# Patient Record
Sex: Male | Born: 1960 | Race: Black or African American | Hispanic: No | Marital: Single | Smoking: Current every day smoker
Health system: Southern US, Community
[De-identification: ages and names within clinical notes are randomized; demographics above are authoritative.]

## PROBLEM LIST (undated history)

## (undated) DIAGNOSIS — S31119A Laceration without foreign body of abdominal wall, unspecified quadrant without penetration into peritoneal cavity, initial encounter: Secondary | ICD-10-CM

## (undated) DIAGNOSIS — IMO0001 Reserved for inherently not codable concepts without codable children: Secondary | ICD-10-CM

## (undated) DIAGNOSIS — K219 Gastro-esophageal reflux disease without esophagitis: Secondary | ICD-10-CM

## (undated) DIAGNOSIS — M1990 Unspecified osteoarthritis, unspecified site: Secondary | ICD-10-CM

## (undated) DIAGNOSIS — W3400XA Accidental discharge from unspecified firearms or gun, initial encounter: Secondary | ICD-10-CM

## (undated) DIAGNOSIS — Z9289 Personal history of other medical treatment: Secondary | ICD-10-CM

## (undated) HISTORY — PX: ABDOMINAL SURGERY: SHX537

## (undated) HISTORY — PX: ORIF FEMORAL SHAFT FRACTURE W/ PLATES AND SCREWS: SUR931

## (undated) HISTORY — PX: HARDWARE REMOVAL: SHX979

## (undated) HISTORY — PX: ORIF ANKLE FRACTURE: SHX5408

---

## 1997-06-22 DIAGNOSIS — Z9289 Personal history of other medical treatment: Secondary | ICD-10-CM

## 1997-06-22 HISTORY — DX: Personal history of other medical treatment: Z92.89

## 1998-06-20 ENCOUNTER — Emergency Department (HOSPITAL_COMMUNITY): Admission: EM | Admit: 1998-06-20 | Discharge: 1998-06-20 | Payer: Self-pay | Admitting: Emergency Medicine

## 1999-06-01 ENCOUNTER — Encounter: Payer: Self-pay | Admitting: General Surgery

## 1999-06-01 ENCOUNTER — Inpatient Hospital Stay (HOSPITAL_COMMUNITY): Admission: EM | Admit: 1999-06-01 | Discharge: 1999-06-07 | Payer: Self-pay

## 1999-06-02 ENCOUNTER — Encounter: Payer: Self-pay | Admitting: General Surgery

## 1999-06-03 ENCOUNTER — Encounter: Payer: Self-pay | Admitting: General Surgery

## 1999-06-04 ENCOUNTER — Encounter: Payer: Self-pay | Admitting: General Surgery

## 1999-06-05 ENCOUNTER — Encounter: Payer: Self-pay | Admitting: General Surgery

## 1999-06-06 ENCOUNTER — Encounter: Payer: Self-pay | Admitting: General Surgery

## 2000-07-01 ENCOUNTER — Ambulatory Visit (HOSPITAL_COMMUNITY): Admission: RE | Admit: 2000-07-01 | Discharge: 2000-07-01 | Payer: Self-pay | Admitting: *Deleted

## 2002-02-05 ENCOUNTER — Emergency Department (HOSPITAL_COMMUNITY): Admission: EM | Admit: 2002-02-05 | Discharge: 2002-02-05 | Payer: Self-pay | Admitting: Emergency Medicine

## 2002-06-30 ENCOUNTER — Encounter: Admission: RE | Admit: 2002-06-30 | Discharge: 2002-06-30 | Payer: Self-pay | Admitting: Internal Medicine

## 2004-05-20 ENCOUNTER — Emergency Department (HOSPITAL_COMMUNITY): Admission: EM | Admit: 2004-05-20 | Discharge: 2004-05-20 | Payer: Self-pay | Admitting: Family Medicine

## 2004-05-20 ENCOUNTER — Ambulatory Visit: Payer: Self-pay | Admitting: Family Medicine

## 2004-10-27 ENCOUNTER — Emergency Department (HOSPITAL_COMMUNITY): Admission: EM | Admit: 2004-10-27 | Discharge: 2004-10-27 | Payer: Self-pay | Admitting: Family Medicine

## 2005-05-13 ENCOUNTER — Ambulatory Visit: Payer: Self-pay | Admitting: Nurse Practitioner

## 2005-10-28 ENCOUNTER — Ambulatory Visit: Payer: Self-pay | Admitting: Nurse Practitioner

## 2005-11-10 ENCOUNTER — Ambulatory Visit: Payer: Self-pay | Admitting: Nurse Practitioner

## 2006-12-16 ENCOUNTER — Ambulatory Visit: Payer: Self-pay | Admitting: Family Medicine

## 2007-10-04 ENCOUNTER — Ambulatory Visit: Payer: Self-pay | Admitting: Family Medicine

## 2008-05-07 ENCOUNTER — Encounter: Payer: Self-pay | Admitting: Physician Assistant

## 2008-05-07 ENCOUNTER — Encounter (INDEPENDENT_AMBULATORY_CARE_PROVIDER_SITE_OTHER): Payer: Self-pay | Admitting: Internal Medicine

## 2008-05-07 ENCOUNTER — Ambulatory Visit: Payer: Self-pay | Admitting: Family Medicine

## 2008-05-07 LAB — CONVERTED CEMR LAB
ALT: 14 units/L (ref 0–53)
Albumin: 4.9 g/dL (ref 3.5–5.2)
BUN: 14 mg/dL (ref 6–23)
Basophils Relative: 0 % (ref 0–1)
Chloride: 105 meq/L (ref 96–112)
Cholesterol: 222 mg/dL — ABNORMAL HIGH (ref 0–200)
Creatinine, Ser: 1.44 mg/dL (ref 0.40–1.50)
Eosinophils Relative: 2 % (ref 0–5)
Hemoglobin: 16.5 g/dL (ref 13.0–17.0)
Lymphocytes Relative: 36 % (ref 12–46)
Monocytes Absolute: 0.3 10*3/uL (ref 0.1–1.0)
Monocytes Relative: 7 % (ref 3–12)
Neutro Abs: 2.8 10*3/uL (ref 1.7–7.7)
Potassium: 4.4 meq/L (ref 3.5–5.3)
Total Bilirubin: 0.4 mg/dL (ref 0.3–1.2)
Total Protein: 7.8 g/dL (ref 6.0–8.3)
Triglycerides: 592 mg/dL — ABNORMAL HIGH (ref <150)
WBC: 5 10*3/uL (ref 4.0–10.5)

## 2009-10-17 ENCOUNTER — Inpatient Hospital Stay (HOSPITAL_COMMUNITY): Admission: EM | Admit: 2009-10-17 | Discharge: 2009-10-19 | Payer: Self-pay | Admitting: Emergency Medicine

## 2009-10-29 ENCOUNTER — Ambulatory Visit: Payer: Self-pay | Admitting: Physician Assistant

## 2009-10-29 ENCOUNTER — Telehealth: Payer: Self-pay | Admitting: Physician Assistant

## 2009-10-29 DIAGNOSIS — M545 Low back pain: Secondary | ICD-10-CM | POA: Insufficient documentation

## 2009-10-29 DIAGNOSIS — J4489 Other specified chronic obstructive pulmonary disease: Secondary | ICD-10-CM | POA: Insufficient documentation

## 2009-10-29 DIAGNOSIS — K219 Gastro-esophageal reflux disease without esophagitis: Secondary | ICD-10-CM

## 2009-10-29 DIAGNOSIS — J449 Chronic obstructive pulmonary disease, unspecified: Secondary | ICD-10-CM | POA: Insufficient documentation

## 2009-10-29 DIAGNOSIS — E781 Pure hyperglyceridemia: Secondary | ICD-10-CM | POA: Insufficient documentation

## 2009-10-29 LAB — CONVERTED CEMR LAB: HIV: NEGATIVE

## 2009-10-30 LAB — CONVERTED CEMR LAB
AST: 18 units/L (ref 0–37)
Albumin: 4.5 g/dL (ref 3.5–5.2)
Barbiturate Quant, Ur: NEGATIVE
Basophils Absolute: 0 10*3/uL (ref 0.0–0.1)
CO2: 24 meq/L (ref 19–32)
Calcium: 9.5 mg/dL (ref 8.4–10.5)
Chloride: 101 meq/L (ref 96–112)
Cocaine Metabolites: POSITIVE — AB
Creatinine, Ser: 1.17 mg/dL (ref 0.40–1.50)
Creatinine,U: 217.6 mg/dL
HCT: 44.2 % (ref 39.0–52.0)
Lymphs Abs: 1.7 10*3/uL (ref 0.7–4.0)
Methadone: NEGATIVE
Monocytes Absolute: 0.4 10*3/uL (ref 0.1–1.0)
Neutrophils Relative %: 61 % (ref 43–77)
Platelets: 387 10*3/uL (ref 150–400)
Potassium: 4.6 meq/L (ref 3.5–5.3)
Propoxyphene: NEGATIVE
RDW: 12.8 % (ref 11.5–15.5)
TSH: 1.528 microintl units/mL (ref 0.350–4.500)
Total Bilirubin: 0.4 mg/dL (ref 0.3–1.2)

## 2009-11-22 ENCOUNTER — Ambulatory Visit (HOSPITAL_COMMUNITY): Admission: RE | Admit: 2009-11-22 | Discharge: 2009-11-22 | Payer: Self-pay | Admitting: Internal Medicine

## 2009-11-27 ENCOUNTER — Encounter: Payer: Self-pay | Admitting: Physician Assistant

## 2009-11-28 ENCOUNTER — Encounter: Payer: Self-pay | Admitting: Physician Assistant

## 2009-12-09 ENCOUNTER — Telehealth: Payer: Self-pay | Admitting: Physician Assistant

## 2009-12-28 ENCOUNTER — Encounter: Payer: Self-pay | Admitting: Physician Assistant

## 2010-01-29 ENCOUNTER — Ambulatory Visit: Payer: Self-pay | Admitting: Physician Assistant

## 2010-01-29 DIAGNOSIS — D485 Neoplasm of uncertain behavior of skin: Secondary | ICD-10-CM

## 2010-01-29 LAB — CONVERTED CEMR LAB
Cholesterol: 156 mg/dL (ref 0–200)
LDL Cholesterol: 86 mg/dL (ref 0–99)
Triglycerides: 96 mg/dL (ref <150)

## 2010-01-31 ENCOUNTER — Encounter: Payer: Self-pay | Admitting: Physician Assistant

## 2010-03-27 ENCOUNTER — Ambulatory Visit: Payer: Self-pay | Admitting: Physician Assistant

## 2010-03-27 DIAGNOSIS — M25519 Pain in unspecified shoulder: Secondary | ICD-10-CM | POA: Insufficient documentation

## 2010-03-27 DIAGNOSIS — M542 Cervicalgia: Secondary | ICD-10-CM | POA: Insufficient documentation

## 2010-07-22 NOTE — Progress Notes (Signed)
Summary: needs FLP  Phone Note Outgoing Call   Summary of Call: Forgot to put on his d/c sheet that he needs to come back to the lab before his next appt for Fasting Lipids. Please call him and set this up. Initial call taken by: Brynda Rim,  Oct 29, 2009 1:26 PM  Follow-up for Phone Call        tried calling pt but no answer .Marland KitchenMarland KitchenArmenia Shannon  Oct 30, 2009 8:43 AM   Additional Follow-up for Phone Call Additional follow up Details #1::        Graciela, please call to pt's to schedule.  Left message on the voice mail.Manon Hilding  Nov 04, 2009 2:47 PM Additional Follow-up by: Mikey College CMA,  Nov 01, 2009 2:38 PM    Additional Follow-up for Phone Call Additional follow up Details #2::    PATIENT IS SCHEDULED TO DO FASTING LABS 05/27 AT 9:10. Follow-up by: Leodis Rains,  Nov 07, 2009 4:52 PM

## 2010-07-22 NOTE — Letter (Signed)
Summary: *HSN Results Follow up  [facility]  [address] [place], [place] [address]   Phone: [phone]  Fax: [phone]      11/27/2009   LISTON THUM [address] [place], [place]  [address]   Dear  Mr. IZEN PETZ,                            ____S.Drinkard,FNP   ____D. Gore,FNP       ____B. McPherson,MD   ____V. Rankins,MD    ____E. Mulberry,MD    ____N. Daphine Deutscher, FNP  ____D. Reche Dixon, MD    ____K. Philipp Deputy, MD    __x__S. Alben Spittle, PA-C    This letter is to inform you that your recent test(s):  _______Pap Smear    _______Lab Test     ___x____X-ray    ___x____ is within acceptable limits  _______ requires a medication change  _______ requires a follow-up lab visit  _______ requires a follow-up visit with your provider   Comments:  Your MRI on your back demonstrates that your discs are normal and you just have some very mild arthritis.  That is good news.       _________________________________________________________ If you have any questions, please contact our office                     Sincerely,  Tereso Newcomer PA-C [facility]

## 2010-07-22 NOTE — Letter (Signed)
Summary: AGREEMENT FOR CONTROLLED PRESCRIPTIONS  AGREEMENT FOR CONTROLLED PRESCRIPTIONS   Imported By: Arta Bruce 11/04/2009 09:24:38  _____________________________________________________________________  External Attachment:    Type:   Image     Comment:   External Document
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
## 2010-07-22 NOTE — Letter (Signed)
Summary: [facility]  [facility]   Imported By: Arta Bruce 04/07/2010 14:46:52  _____________________________________________________________________  External Attachment:    Type:   Image     Comment:   External Document

## 2010-07-22 NOTE — Letter (Signed)
Summary: *HSN Results Follow up  [facility]  [address] [place], [place] [address]   Phone: [phone]  Fax: [phone]      01/31/2010   Ethan Martinez [address] [place], [place]  [address]   Dear  [name],                            ____S.Drinkard,FNP   ____D. Gore,FNP       ____B. McPherson,MD   ____V. Rankins,MD    ____E. Mulberry,MD    ____N. Daphine Deutscher, FNP  ____D. Reche Dixon, MD    ____K. Philipp Deputy, MD    __x__S. Alben Spittle, PA-C     This letter is to inform you that your recent test(s):  _______Pap Smear    ___x____Lab Test     _______X-ray    ___x____ is within acceptable limits  _______ requires a medication change  _______ requires a follow-up lab visit  _______ requires a follow-up visit with your provider   Comments: Your cholesterol numbers are normal without you being on medicine.  You do not need medicine for your cholesterol.       _________________________________________________________ If you have any questions, please contact our office                     Sincerely,  Tereso Newcomer PA-C [facility]

## 2010-07-22 NOTE — Assessment & Plan Note (Signed)
Summary: right shoulder and neck pain   Vital Signs:  Patient profile:   50 year old male Weight:      170.9 pounds Temp:     97.7 degrees F oral Pulse rate:   84 / minute Pulse rhythm:   regular Resp:     18 per minute BP sitting:   114 / 80  (left arm) Cuff size:   regular  Vitals Entered By: CMA Student Kenyatta  CC: follow-up visit left leg pain, patient still having leg pain, needs medication refill Is Patient Diabetic? No Pain Assessment Patient in pain? yes     Location: left leg Intensity: 5 Type: aching  Does patient need assistance? Functional Status Self care Ambulation Normal   Primary Care Provider:  Tereso Newcomer, PA-C  CC:  follow-up visit left leg pain, patient still having leg pain, and needs medication refill.  History of Present Illness: Here for right shoulder and neck pain.  He broke his arm years ago.  Says he wore a cast.  Also reports going to get injections in his shoulder at one point.  He bumped into a door frame recently.  Since then, his shoulder and neck have been bothering him.  He points to pain over his trap muscle and his anterior shoulder.  He has full ROM.  He does not have naprosyn anymore.  He notes more pain at night and has a hard time sleeping.  Any type of movement elicits pain.    He is also asking for a form to be filled out from [place] [employer] to forgive school loans related to his disability.  I reviewed the form.  It asks specific ques about his dx for disability and his functional limitations.  I have asked him to obtain a copy of his judgement to bring in.  I do not have access to the specifics of his disability.  Once I have this, I can try to fill out his form.  Problems Prior to Update: 1)  Neck Pain  (ICD-723.1) 2)  Shoulder Pain, Right  (ICD-719.41) 3)  Lesion, Scalp  (ICD-238.2) 4)  Preventive Health Care  (ICD-V70.0) 5)  Hypertriglyceridemia  (ICD-272.1) 6)  Low Back Pain, Chronic  (ICD-724.2) 7)  COPD  (ICD-496) 8)   Gerd  (ICD-530.81)  Current Medications (verified): 1)  Pepcid 20 Mg Tabs (Famotidine) .... Take 1 Tablet By Mouth Two Times A Day As Needed For Indigestion 2)  Naproxen 500 Mg Tabs (Naproxen) .... Take 1 Tablet By Mouth Two Times A Day As Needed For Pain (Take With Food) 3)  Robaxin 500 Mg Tabs (Methocarbamol) .Marland Kitchen.. 1-2 Tabs By Mouth Every 8 Hours As Needed For Back Pain or Spasm 4)  Not Allowed To Receive Narcotics At This Office .... See Labs Ammended 10/30/2009 5)  Famotidine 20 Mg Tabs (Famotidine) .... Take 1 Tablet Twice Dailyas Needed For Indigestion 6)  Doxycycline Hyclate 100 Mg Caps (Doxycycline Hyclate) .... Take 1 Capsule Twice Daily 7)  Ketoconazole 2 % Sham (Ketoconazole) .... Lather Into Scalp and Rinse Out After 5 Minutes; Repeat 1 Time A Week For 4 Weeks  Allergies (verified): No Known Drug Allergies  Physical Exam  General:  alert, well-developed, and well-nourished.   Head:  normocephalic and atraumatic.   Lungs:  normal breath sounds.   Heart:  normal rate and regular rhythm.   Neurologic:  alert & oriented X3 and cranial nerves II-XII intact.   BUE strength is normal and equal Psych:  normally  interactive.     Shoulder/Elbow Exam  Shoulder Exam:    Right:    Inspection:  Normal    Palpation:  Normal    Stability:  stable    Tenderness:  right bicipital groove    Swelling:  no    Erythema:  no    + crepitus noted empty can test neg   Impression & Recommendations:  Problem # 1:  NECK PAIN (ICD-723.1) likely strained nsaids and muscle relaxers note:  pain contract broken and he cannot have narcotics check shoulder xray . . .if ok, send to PT  His updated medication list for this problem includes:    Naproxen 500 Mg Tabs (Naproxen) .Marland Kitchen... Take 1 tablet by mouth two times a day as needed for pain (take with food)    Robaxin 500 Mg Tabs (Methocarbamol) .Marland Kitchen... 1-2 tabs by mouth every 8 hours as needed for pain or spasm  Problem # 2:  SHOULDER PAIN, RIGHT  (ICD-719.41)  prob strain nsaids and muscle relaxers check xray if sig DJD, send back to ortho for poss injection if ok, send to PT for neck and shoulder  His updated medication list for this problem includes:    Naproxen 500 Mg Tabs (Naproxen) .Marland Kitchen... Take 1 tablet by mouth two times a day as needed for pain (take with food)    Robaxin 500 Mg Tabs (Methocarbamol) .Marland Kitchen... 1-2 tabs by mouth every 8 hours as needed for pain or spasm  Orders: Diagnostic X-Ray/Fluoroscopy (Diagnostic X-Ray/Flu)  Complete Medication List: 1)  Pepcid 20 Mg Tabs (Famotidine) .... Take 1 tablet by mouth two times a day as needed for indigestion 2)  Naproxen 500 Mg Tabs (Naproxen) .... Take 1 tablet by mouth two times a day as needed for pain (take with food) 3)  Robaxin 500 Mg Tabs (Methocarbamol) .Marland Kitchen.. 1-2 tabs by mouth every 8 hours as needed for pain or spasm 4)  Not Allowed To Receive Narcotics At This Office  .... See labs ammended 10/30/2009 5)  Famotidine 20 Mg Tabs (Famotidine) .... Take 1 tablet twice dailyas needed for indigestion 6)  Doxycycline Hyclate 100 Mg Caps (Doxycycline hyclate) .... Take 1 capsule twice daily 7)  Ketoconazole 2 % Sham (Ketoconazole) .... Lather into scalp and rinse out after 5 minutes; repeat 1 time a week for 4 weeks  Patient Instructions: 1)  Take naproxen two times a day with food for 3 days, every day.  Then, take two times a day with food as needed for pain. 2)  Use robaxin as needed for spasm or pain. 3)  Get the xrays done. 4)  We will either send you to the orthopedist or physical therapist depending on xray results 5)  Schedule follow up if symptoms worsen or not getting better. Prescriptions: ROBAXIN 500 MG TABS (METHOCARBAMOL) 1-2 tabs by mouth every 8 hours as needed for pain or spasm  #30 x 0   Entered and Authorized by:   Tereso Newcomer PA-C   Signed by:   Tereso Newcomer PA-C on 03/27/2010   Method used:   Print then Give to Patient   RxID:    [id number] NAPROXEN 500 MG TABS (NAPROXEN) Take 1 tablet by mouth two times a day as needed for pain (take with food)  #60 x 0   Entered and Authorized by:   Tereso Newcomer PA-C   Signed by:   Tereso Newcomer PA-C on 03/27/2010   Method used:   Print then Give to Patient   RxID:  [id number]  

## 2010-07-22 NOTE — Letter (Signed)
Summary: [facility]   Imported By: Arta Bruce 04/07/2010 14:56:10  _____________________________________________________________________  External Attachment:    Type:   Image     Comment:   External Document

## 2010-07-22 NOTE — Progress Notes (Signed)
Summary: Wants ketoconazole shampoo asap  Phone Note Call from Patient   Reason for Call: Refill Medication Summary of Call: ZEMSIBROZIL//STATIN NEED SHAMPOO/   KETOCONAZOLE  HEAD AN CHEST(WHERE EVER HE HAS HAIR ) NEEDS THIS BAD PHARMACY [facility] WILL CALL AND MAKE AN APPT AFTER JUNE 27 TH,HAS ANOTHER APPT ON THAT DATE AND WILL COME SEE YOU AFTER THAT ONE . CAN BE REACHED AT HOME//[phone] //CELL//[phone] Initial call taken by: Arta Bruce,  December 09, 2009 8:38 AM  Follow-up for Phone Call        [place], Can you please clarify what the request is? Follow-up by: Tereso Newcomer PA-C,  December 10, 2009 1:03 PM  Additional Follow-up for Phone Call Additional follow up Details #1::        He states that he has "high yeast level" -- has rash wherever he has hair -- red, scaly, itches badly and whenever he combs his hair, the areas bleed.  States he went to [facility] a long time ago, and was prescribed ketoconazole shampoo and needs it again "it's like an emergency".  States he had  this itching and scaly skin when he saw you in May, but just forgot to tell you about it.  Uses [facility] on [address].    Also wanted to get his Gemfibrozil reordered -- appt. made for FLP 12/30/09.  Dutch Quint RN  December 11, 2009 10:56 AM  Additional Follow-up by: Dutch Quint RN,  December 11, 2009 12:23 PM    Additional Follow-up for Phone Call Additional follow up Details #2::      I need to see this rash before I give him a prescription. Selsun shampoo is very similar to what he is requesting and often used for some of the same conditions as ketoconazole.  He can use that in the meantime.  Try to get him in for a visit as soon as we can. . . either me or another provider.  I requested a fasting lipid panel on him in May.  Have not seen anything come back yet.  I need this before I decide what he should be on for his cholesterol.  Follow-up by: Tereso Newcomer PA-C,  December 11, 2009  1:46 PM  Additional Follow-up for Phone Call Additional follow up Details #3:: Details for Additional Follow-up Action Taken: Appt made for FLP and office visit on 12/30/09 per his request.  Advised per provider's recommendations -- will try Selsun shampoo.  Dutch Quint RN  December 11, 2009 2:52 PM
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
## 2010-09-09 LAB — PROTIME-INR
INR: 1.01 (ref 0.00–1.49)
Prothrombin Time: 13.2 seconds (ref 11.6–15.2)

## 2010-09-09 LAB — BASIC METABOLIC PANEL
Chloride: 112 mEq/L (ref 96–112)
Glucose, Bld: 132 mg/dL — ABNORMAL HIGH (ref 70–99)

## 2010-09-09 LAB — CBC
HCT: 45.4 % (ref 39.0–52.0)
Hemoglobin: 15.7 g/dL (ref 13.0–17.0)
Platelets: 249 10*3/uL (ref 150–400)
RBC: 4.99 MIL/uL (ref 4.22–5.81)
RDW: 13.2 % (ref 11.5–15.5)

## 2010-09-09 LAB — TYPE AND SCREEN

## 2010-09-09 LAB — ABO/RH: ABO/RH(D): A POS
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
## 2011-12-15 ENCOUNTER — Encounter (HOSPITAL_COMMUNITY): Payer: Self-pay | Admitting: *Deleted

## 2011-12-15 ENCOUNTER — Emergency Department (HOSPITAL_COMMUNITY)
Admission: EM | Admit: 2011-12-15 | Discharge: 2011-12-15 | Disposition: A | Discharge status: Home / Self Care | Payer: Medicare Other | Attending: Emergency Medicine | Admitting: Emergency Medicine

## 2011-12-15 DIAGNOSIS — R21 Rash and other nonspecific skin eruption: Secondary | ICD-10-CM | POA: Insufficient documentation

## 2011-12-15 DIAGNOSIS — F17200 Nicotine dependence, unspecified, uncomplicated: Secondary | ICD-10-CM | POA: Insufficient documentation

## 2011-12-15 DIAGNOSIS — L509 Urticaria, unspecified: Secondary | ICD-10-CM

## 2011-12-15 MED ORDER — DOXYCYCLINE HYCLATE 100 MG PO CAPS: 100.0000 mg | ORAL_CAPSULE | Freq: Two times a day (BID) | ORAL | Status: AC

## 2011-12-15 MED ORDER — KETOCONAZOLE 2 % EX SHAM: MEDICATED_SHAMPOO | CUTANEOUS | Status: AC

## 2011-12-15 MED ORDER — METHYLPREDNISOLONE SODIUM SUCC 125 MG IJ SOLR
125.0000 mg | Freq: Once | INTRAMUSCULAR | Status: AC
  Administered 2011-12-15: 125 mg via INTRAMUSCULAR
  Filled 2011-12-15: qty 2

## 2011-12-15 NOTE — ED Notes (Signed)
Pt is here with rashes to buttocks  And upper legs and arms for last two days

## 2011-12-15 NOTE — Discharge Instructions (Signed)
You were seen and evaluated for your symptoms of rash. Your given a dose of steroids to help with your symptoms. It is recommended that you use Benadryl for itch and rash symptoms at home. You were also given prescription for an antibiotic for concerns of a skin infection. Please take this as prescribed for the full length of time. You were also given prescriptions for shampoo to use for your complaints of dry and irritated scalp. Please followup with your primary care provider for continued evaluation and treatment.   Allergic Reaction, Mild to Moderate Allergies may happen from anything your body is sensitive to. This may be food, medications, pollens, chemicals, and nearly anything around you in everyday life that produces allergens. An allergen is anything that causes an allergy producing substance. Allergens cause your body to release allergic antibodies. Through a chain of events, they cause a release of histamine into the blood stream. Histamines are meant to protect you, but they also cause your discomfort. This is why antihistamines are often used for allergies. Heredity is often a factor in causing allergic reactions. This means you may have some of the same allergies as your parents. Allergies happen in all age groups. You may have some idea of what caused your reaction. There are many allergens around [place]. It may be difficult to know what caused your reaction. If this is a first time event, it may never happen again. Allergies cannot be cured but can be controlled with medications. SYMPTOMS  You may get some or all of the following problems from allergies.  Swelling and itching in and around the mouth.   Tearing, itchy eyes.   Nasal congestion and runny nose.   Sneezing and coughing.   An itchy red rash or hives.   Vomiting or diarrhea.   Difficulty breathing.  Seasonal allergies occur in all age groups. They are seasonal because they usually occur during the same season every year.  They may be a reaction to molds, grass pollens, or tree pollens. Other causes of allergies are house dust mite allergens, pet dander and mold spores. These are just a common few of the thousands of allergens around [place]. All of the symptoms listed above happen when you come in contact with pollens and other allergens. Seasonal allergies are usually not life threatening. They are generally more of a nuisance that can often be handled using medications. Hay fever is a combination of all or some of the above listed allergy problems. It may often be treated with simple over-the-counter medications such as diphenhydramine. Take medication as directed. Check with your caregiver or package insert for child dosages. TREATMENT AND HOME CARE INSTRUCTIONS If hives or rash are present:  Take medications as directed.   You may use an over-the-counter antihistamine (diphenhydramine) for hives and itching as needed. Do not drive or drink alcohol until medications used to treat the reaction have worn off. Antihistamines tend to make people sleepy.   Apply cold cloths (compresses) to the skin or take baths in cool water. This will help itching. Avoid hot baths or showers. Heat will make a rash and itching worse.   If your allergies persist and become more severe, and over the counter medications are not effective, there are many new medications your caretaker can prescribe. Immunotherapy or desensitizing injections can be used if all else fails. Follow up with your caregiver if problems continue.  SEEK MEDICAL CARE IF:   Your allergies are becoming progressively more troublesome.   You suspect  a food allergy. Symptoms generally happen within 30 minutes of eating a food.   Your symptoms have not gone away within 2 days or are getting worse.   You develop new symptoms.   You want to retest yourself or your child with a food or drink you think causes an allergic reaction. Never test yourself or your child of a  suspected allergy without being under the watchful eye of your caregivers. A second exposure to an allergen may be life-threatening.  SEEK IMMEDIATE MEDICAL CARE IF:  You develop difficulty breathing or wheezing, or have a tight feeling in your chest or throat.   You develop a swollen mouth, hives, swelling, or itching all over your body.  A severe reaction with any of the above problems should be considered life-threatening. If you suddenly develop difficulty breathing call for local emergency medical help. THIS IS AN EMERGENCY. MAKE SURE YOU:   Understand these instructions.   Will watch your condition.   Will get help right away if you are not doing well or get worse.  Document Released: 04/05/2007 Document Revised: 05/28/2011 Document Reviewed: 04/05/2007 [facility] Patient Information 2012 [place], [place].    Hives Hives (urticaria) are itchy, red, swollen patches on the skin. They may change size, shape, and location quickly and repeatedly. Hives that occur deeper in the skin can cause swelling of the hands, feet, and face. Hives may be an allergic reaction to something you or your child ate, touched, or put on the skin. Hives can also be a reaction to cold, heat, viral infections, medication, insect bites, or emotional stress. Often the cause is hard to find. Hives can come and go for several days to several weeks. Hives are not contagious. HOME CARE INSTRUCTIONS   If the cause of the hives is known, avoid exposure to that source.   To relieve itching and rash:   Apply cold compresses to the skin or take cool water baths. Do not take or give your child hot baths or showers because the warmth will make the itching worse.   The best medicine for hives is an antihistamine. An antihistamine will not cure hives, but it will reduce their severity. You can use an antihistamine available over the counter. This medicine may make your child sleepy. Teenagers should not drive while using this  medicine.   Take or give an antihistamine every 6 hours until the hives are completely gone for 24 hours or as directed.   Your child may have other medications prescribed for itching. Give these as directed by your child's caregiver.   You or your child should wear loose fitting clothing, including undergarments. Skin irritations may make hives worse.   Follow-up as directed by your caregiver.  SEEK MEDICAL CARE IF:   You or your child still have considerable itching after taking the medication (prescribed or purchased over the counter).   Joint swelling or pain occurs.  SEEK IMMEDIATE MEDICAL CARE IF:   You have a fever.   Swollen lips or tongue are noticed.   There is difficulty with breathing, swallowing, or tightness in the throat or chest.   Abdominal pain develops.   Your child starts acting very sick.  These may be the first signs of a life-threatening allergic reaction. THIS IS AN EMERGENCY. Call 911 for medical help. MAKE SURE YOU:   Understand these instructions.   Will watch your condition.   Will get help right away if you are not doing well or get worse.  Document Released: 06/08/2005 Document Revised: 05/28/2011 Document Reviewed: 01/27/2008 [facility] Patient Information 2012 [place], [place].
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
## 2011-12-16 NOTE — ED Provider Notes (Signed)
Medical screening examination/treatment/procedure(s) were performed by non-physician practitioner and as supervising physician I was immediately available for consultation/collaboration.  Sunnie Nielsen, MD 12/16/11 [phone]

## 2012-01-28 ENCOUNTER — Emergency Department (INDEPENDENT_AMBULATORY_CARE_PROVIDER_SITE_OTHER)
Admission: EM | Admit: 2012-01-28 | Discharge: 2012-01-28 | Disposition: A | Discharge status: Home / Self Care | Payer: Medicare Other | Source: Home / Self Care

## 2012-01-28 ENCOUNTER — Encounter (HOSPITAL_COMMUNITY): Payer: Self-pay | Admitting: *Deleted

## 2012-01-28 DIAGNOSIS — S0993XA Unspecified injury of face, initial encounter: Secondary | ICD-10-CM

## 2012-01-28 DIAGNOSIS — S0011XA Contusion of right eyelid and periocular area, initial encounter: Secondary | ICD-10-CM

## 2012-01-28 DIAGNOSIS — S199XXA Unspecified injury of neck, initial encounter: Secondary | ICD-10-CM

## 2012-01-28 DIAGNOSIS — S0510XA Contusion of eyeball and orbital tissues, unspecified eye, initial encounter: Secondary | ICD-10-CM

## 2012-01-28 MED ORDER — ACETAMINOPHEN 325 MG PO TABS
650.0000 mg | ORAL_TABLET | Freq: Once | ORAL | Status: AC
  Administered 2012-01-28: 650 mg via ORAL

## 2012-01-28 MED ORDER — ACETAMINOPHEN 325 MG PO TABS
ORAL_TABLET | ORAL | Status: AC
  Filled 2012-01-28: qty 2

## 2012-01-28 MED ORDER — TRIAMCINOLONE ACETONIDE 0.1 % EX CREA: TOPICAL_CREAM | Freq: Two times a day (BID) | CUTANEOUS | Status: AC

## 2012-01-28 NOTE — ED Notes (Signed)
Pt given ice pack and visual acuity preformed
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
## 2012-01-29 NOTE — ED Provider Notes (Signed)
Medical screening examination/treatment/procedure(s) were performed by non-physician practitioner and as supervising physician I was immediately available for consultation/collaboration.   [facility]; MD   Sharin Grave, MD 01/30/12 [phone]
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
## 2012-03-03 IMAGING — CR DG CHEST 2V
2 series · 2 of 2 positions shown · non-contrast
Comparison: None.

CLINICAL DATA: Preop for ankle surgery, smoking history

CHEST - 2 VIEW

[w chest lat]
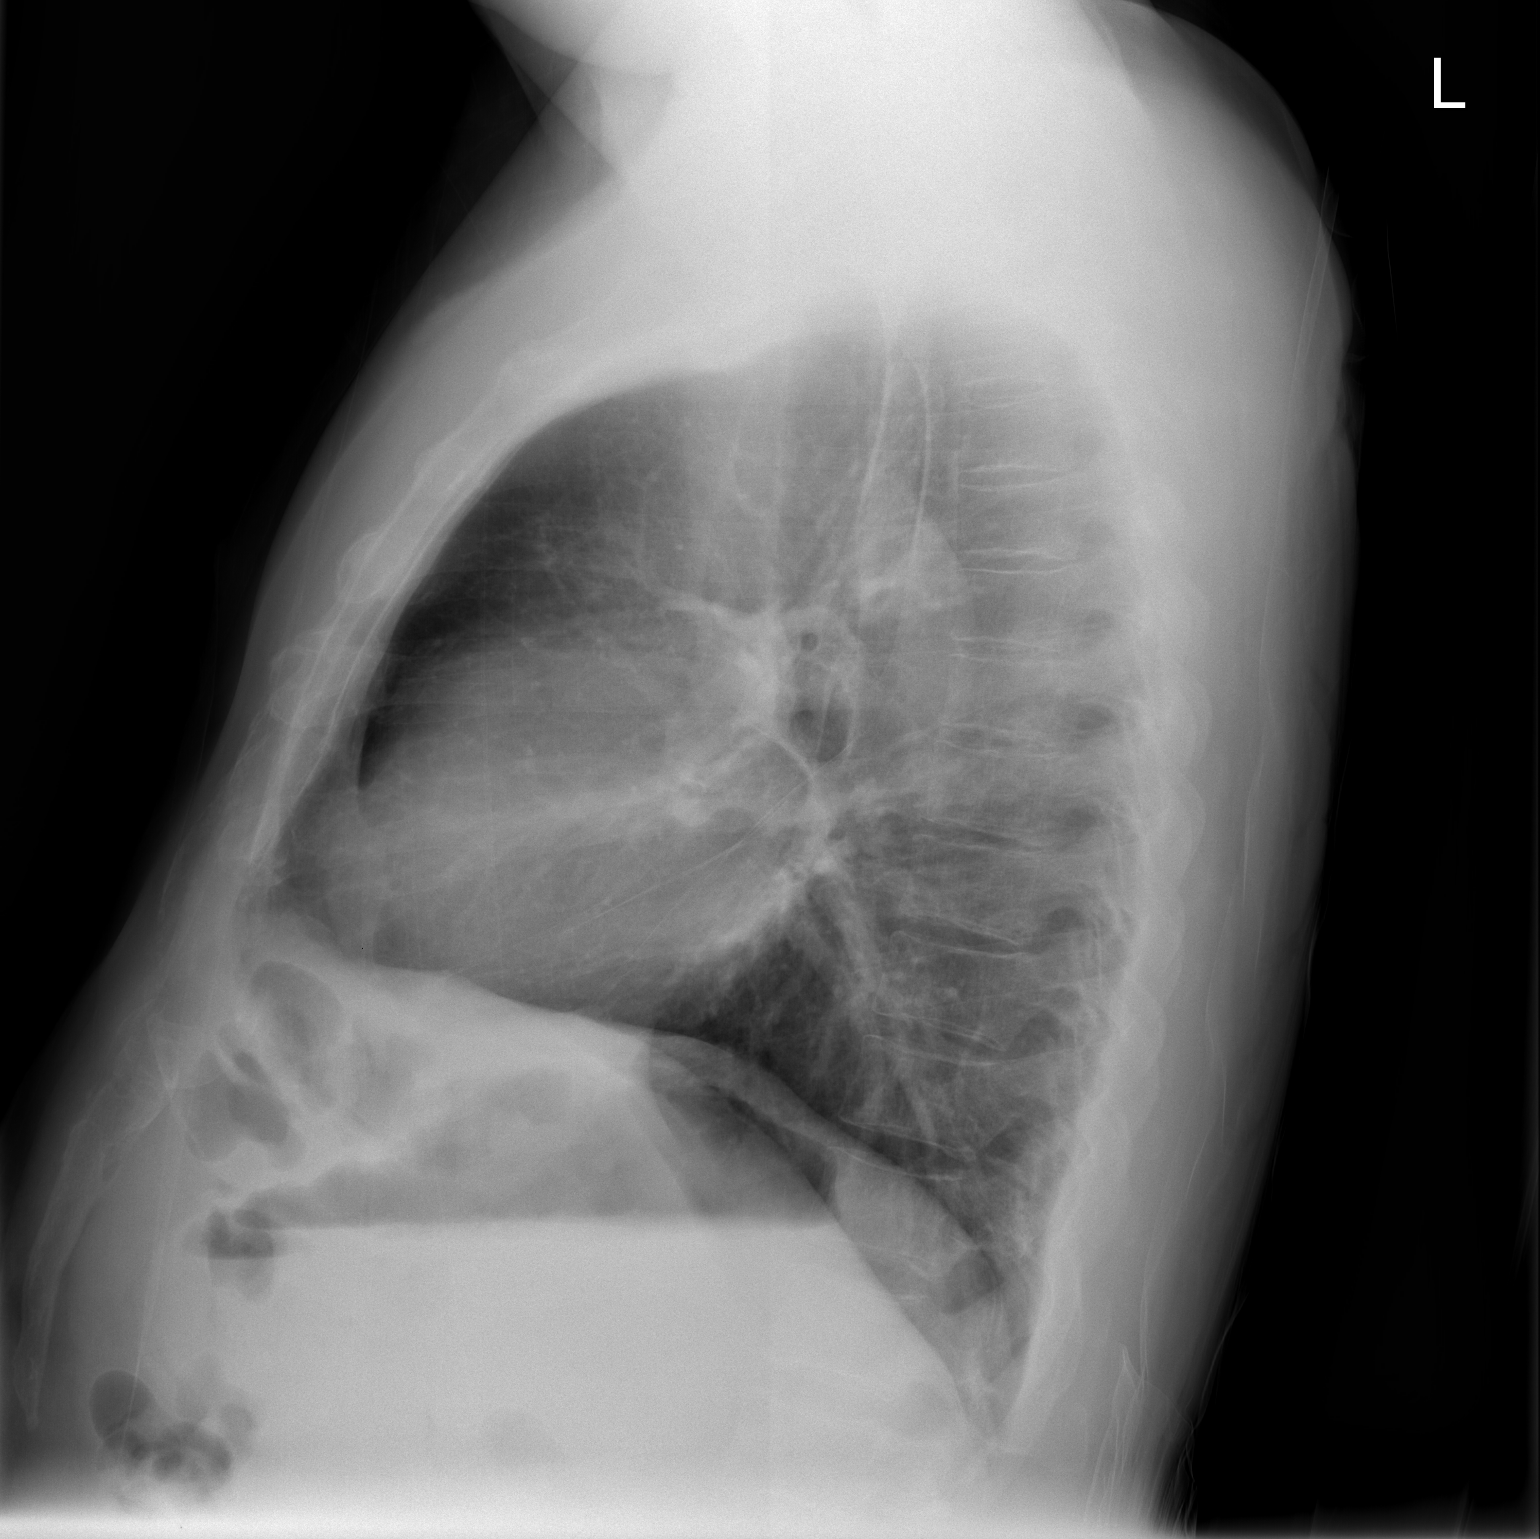

[view not recorded]
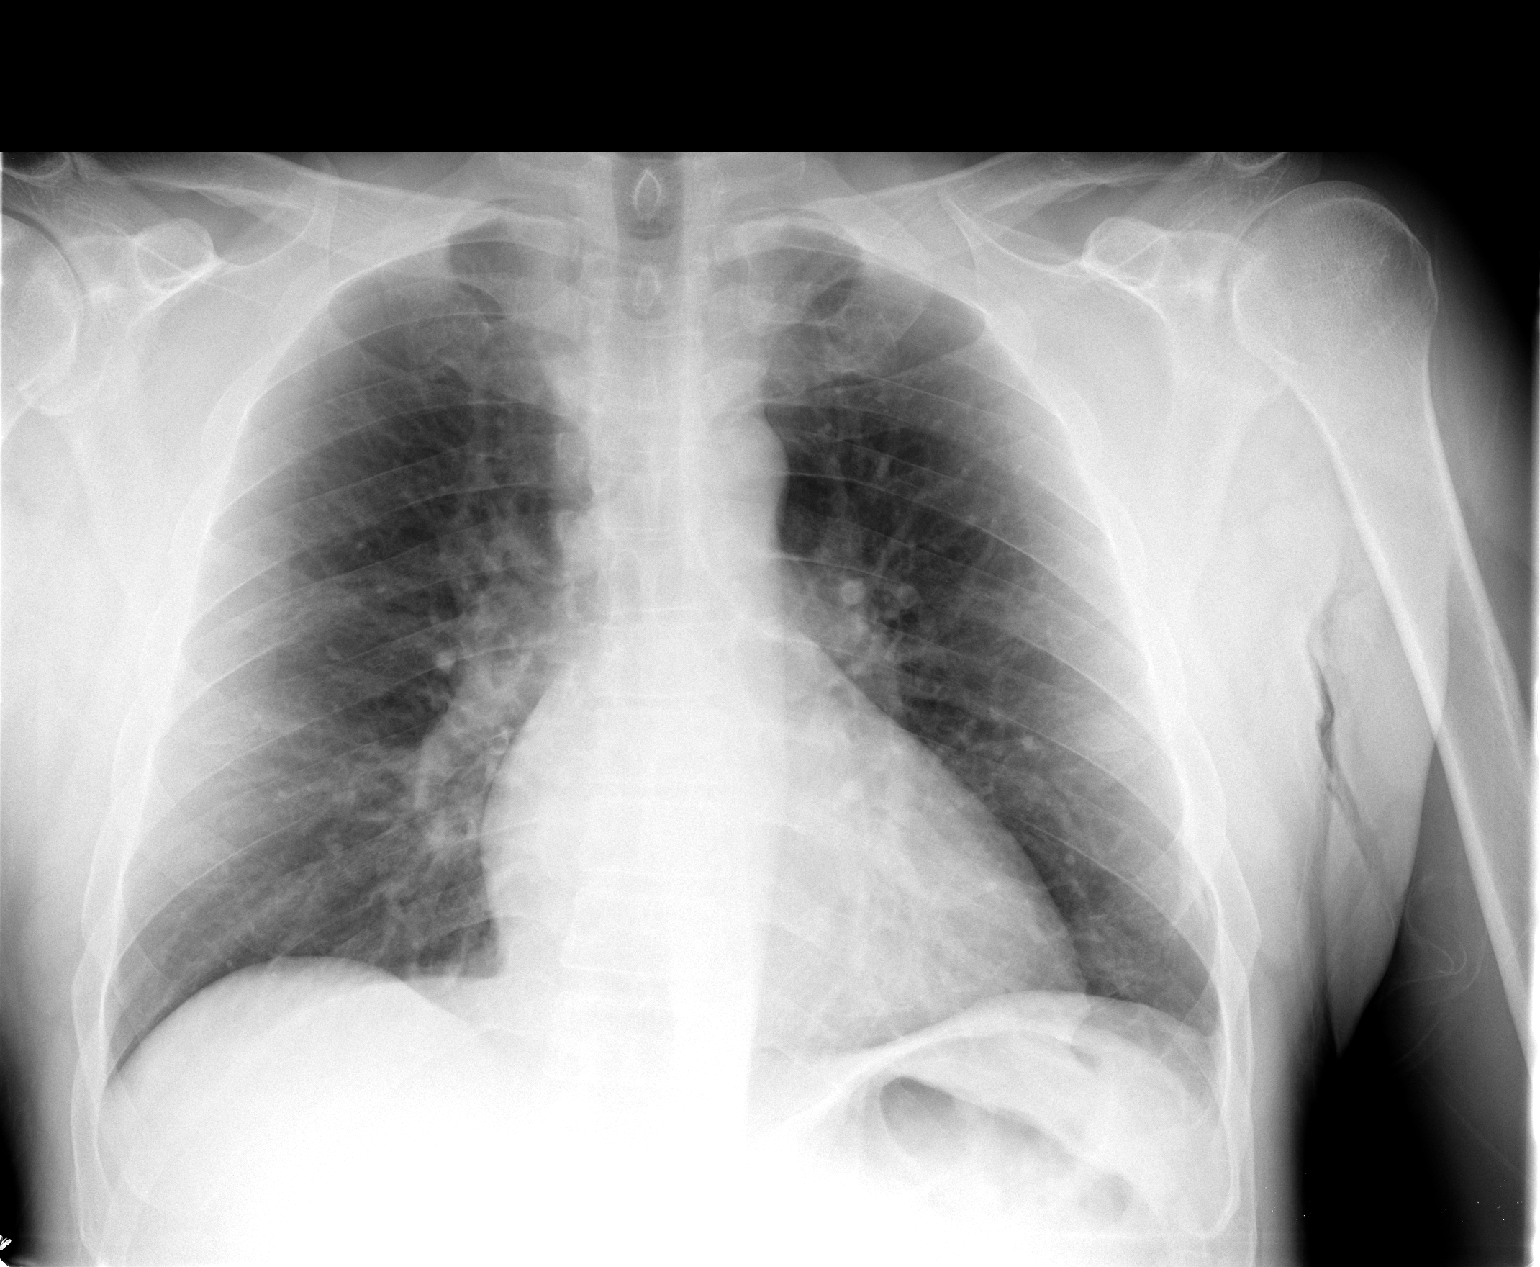

[2 of 2 positions shown; findings below may reference images not displayed]

FINDINGS: The lungs are clear.  The heart is mildly enlarged.
There is some peribronchial thickening consistent with bronchitis.
No bony abnormality is seen.
IMPRESSION: No active lung disease.  Peribronchial thickening consistent with
bronchitis.

## 2012-03-03 IMAGING — CR DG ANKLE PORT 2V*L*
3 series · 3 of 3 positions shown · non-contrast
Comparison: Radiographs earlier today.

CLINICAL DATA: ORIF

PORTABLE LEFT ANKLE - 2 VIEW

[view not recorded (1 of 3)]
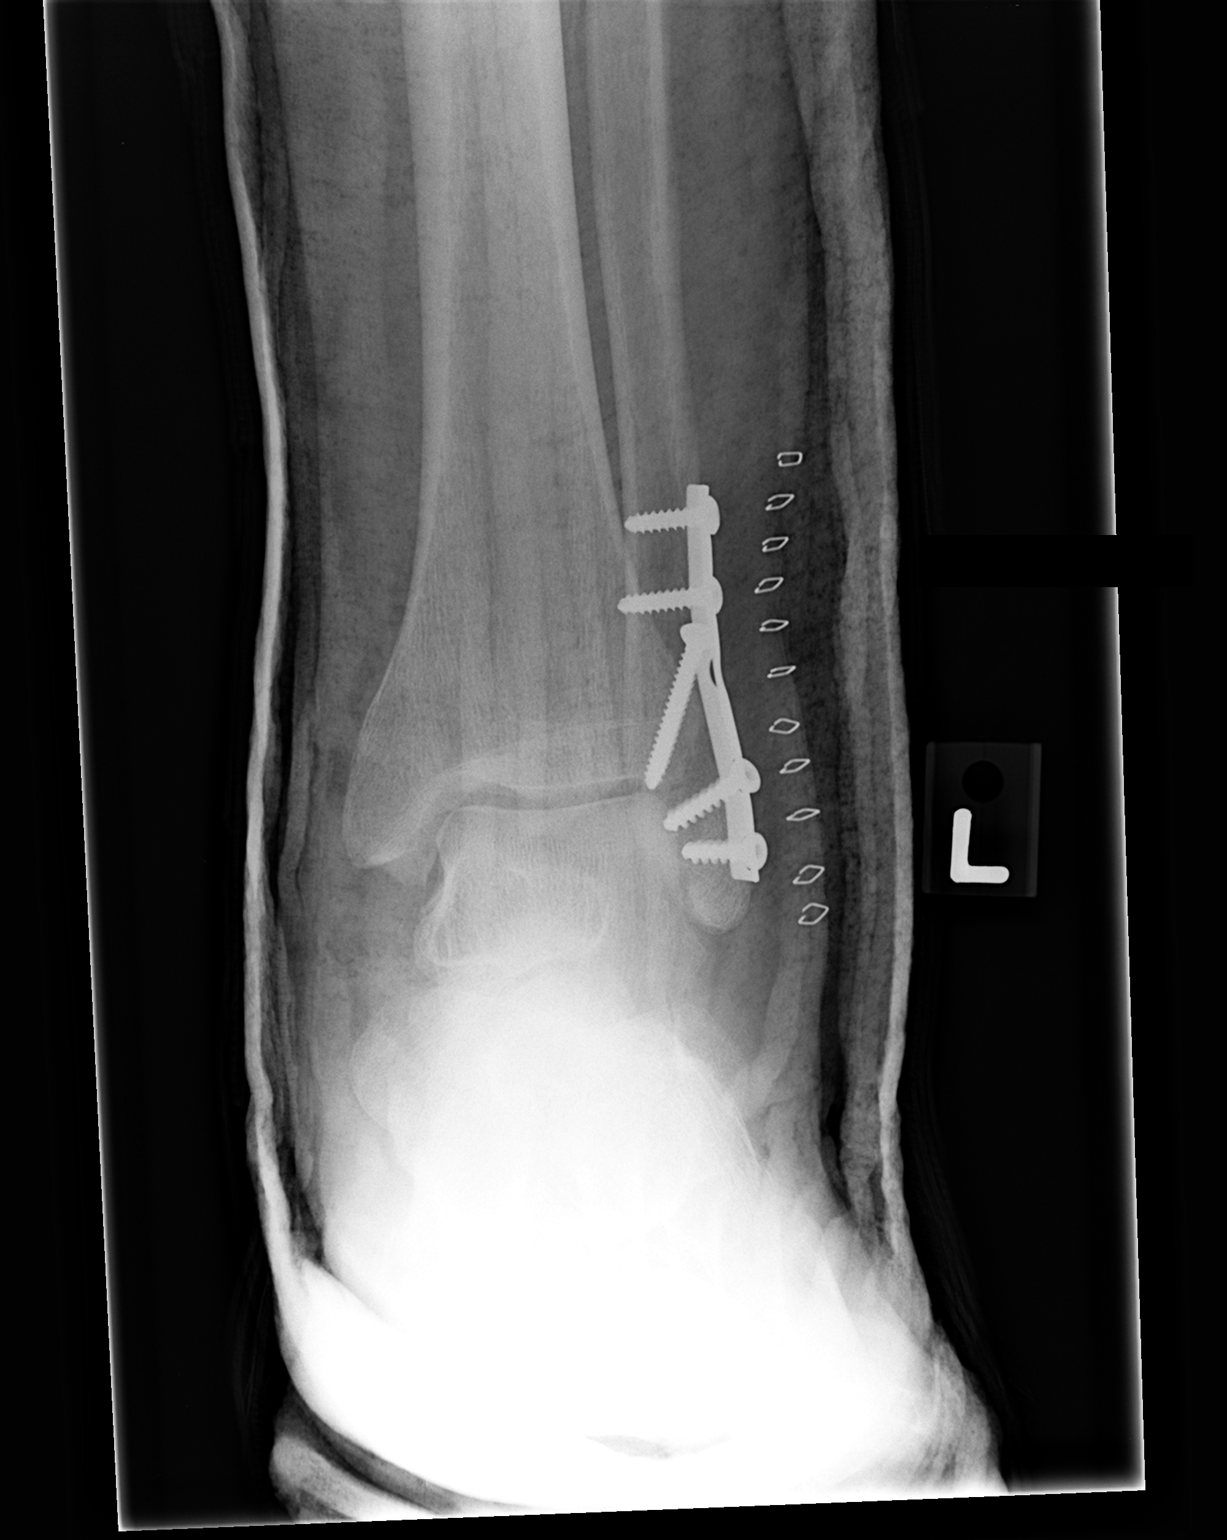

[view not recorded (2 of 3)]
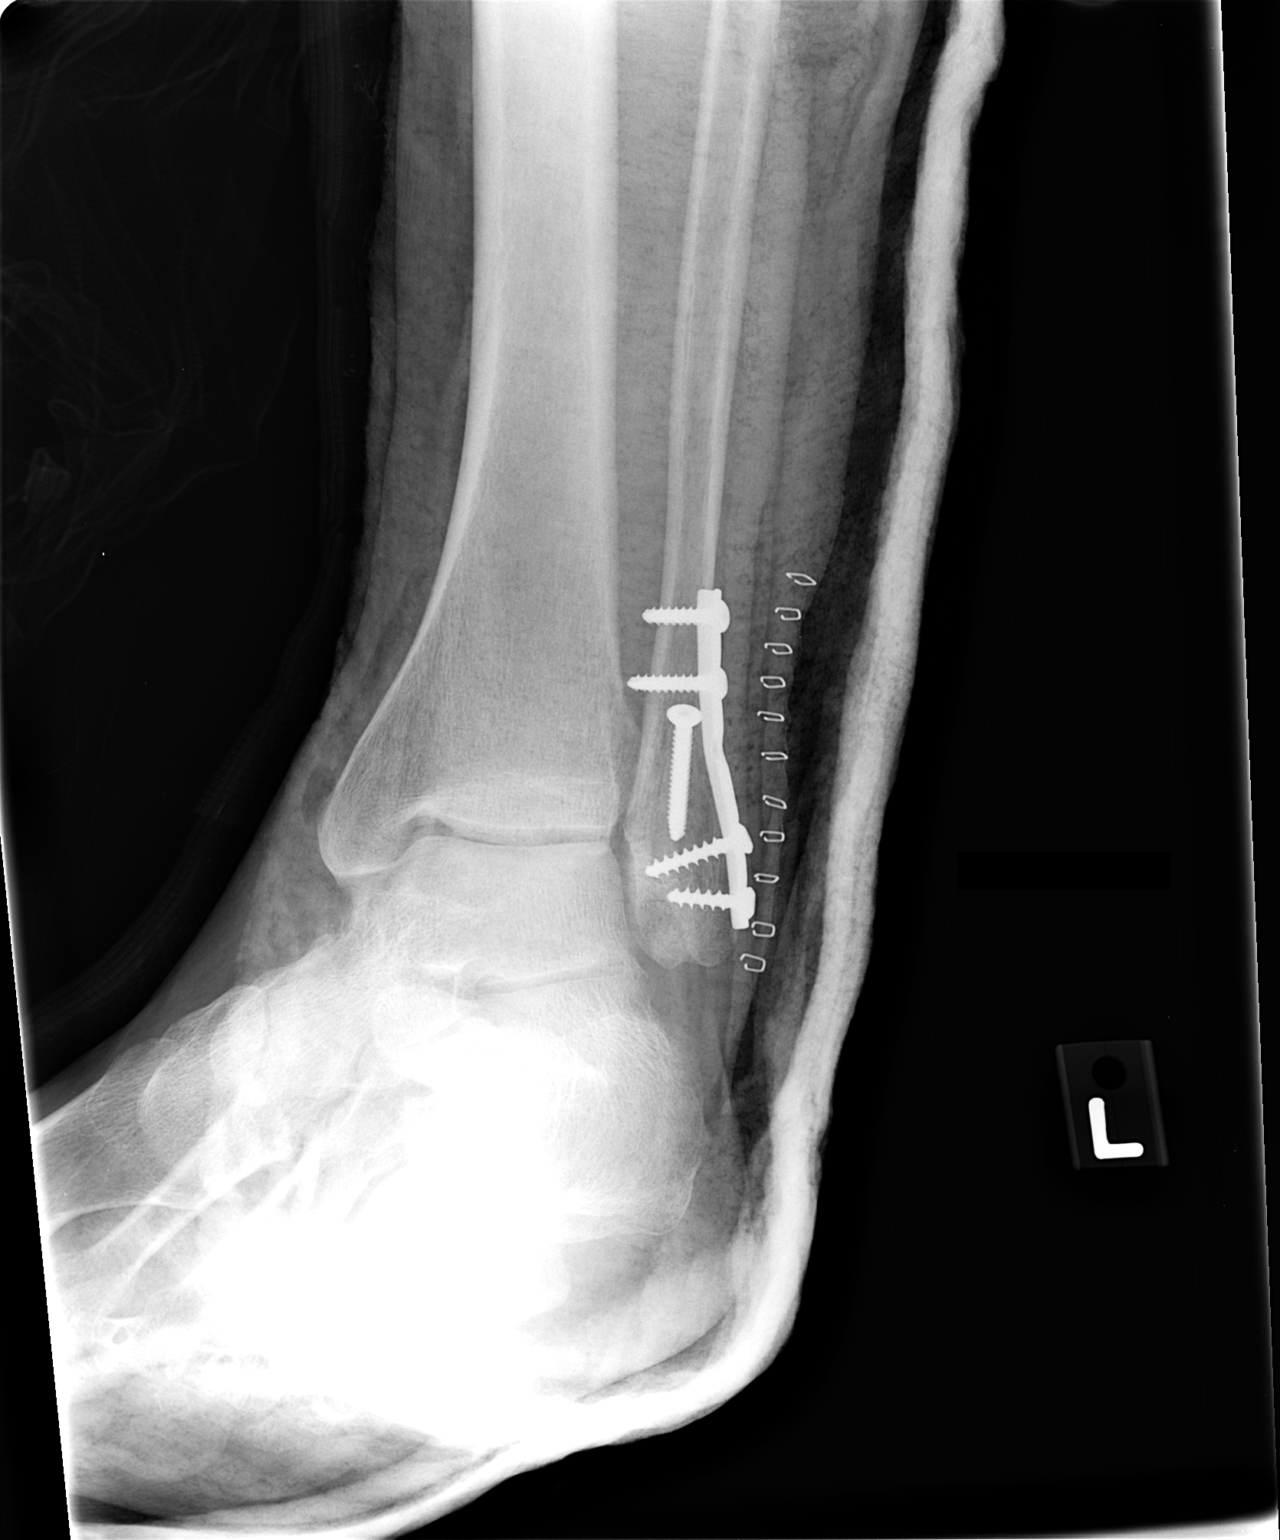

[view not recorded (3 of 3)]
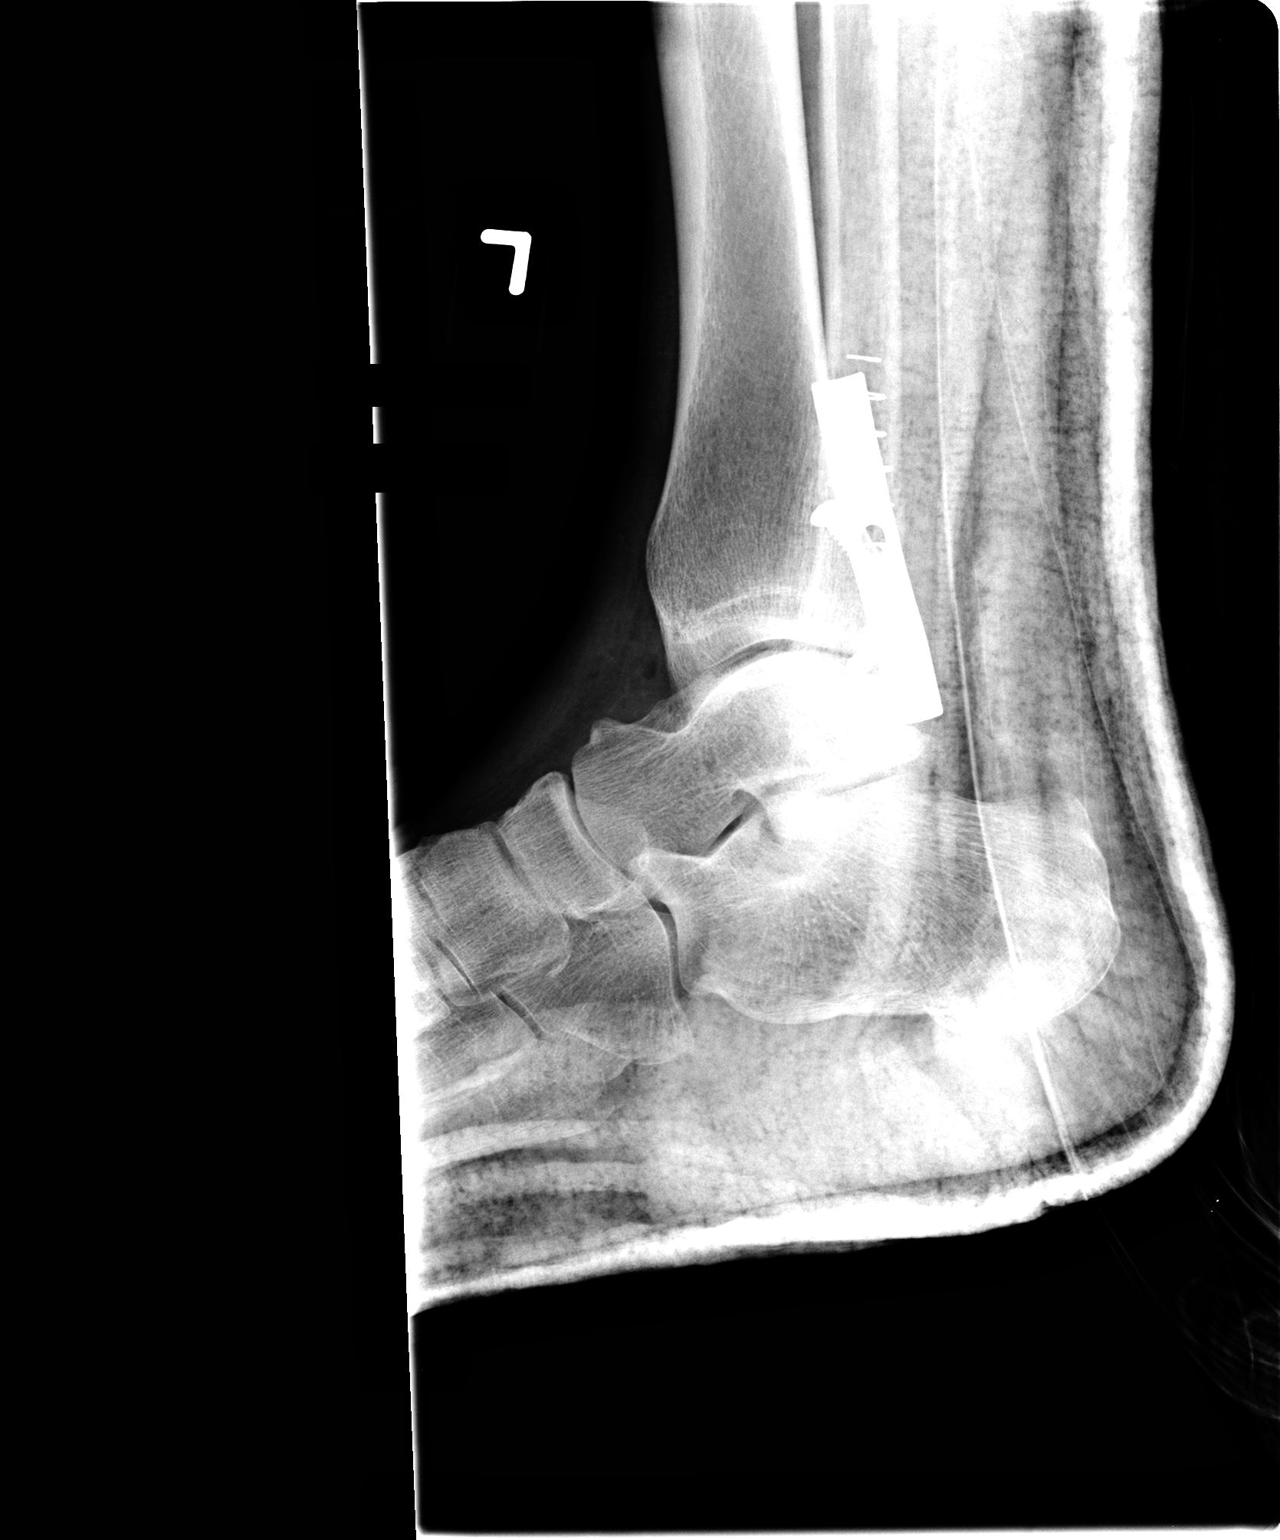

[3 of 3 positions shown; findings below may reference images not displayed]

FINDINGS: Metallic plate lies alongside the lateral aspect of the
distal fibula and lateral malleolus.  Four screws traverse the
plate and fibula.  There is another screw which traverses the
fracture site.  Fracture fragments appear to be in satisfactory
position and alignment. The limb is in a plaster cast.
IMPRESSION: Satisfactory ORIF of left ankle.

## 2012-06-30 ENCOUNTER — Encounter (HOSPITAL_COMMUNITY): Payer: Self-pay

## 2012-06-30 ENCOUNTER — Emergency Department (HOSPITAL_COMMUNITY)
Admission: EM | Admit: 2012-06-30 | Discharge: 2012-06-30 | Disposition: A | Discharge status: Home / Self Care | Payer: Medicare Other | Source: Home / Self Care

## 2012-06-30 DIAGNOSIS — M25519 Pain in unspecified shoulder: Secondary | ICD-10-CM

## 2012-06-30 DIAGNOSIS — E781 Pure hyperglyceridemia: Secondary | ICD-10-CM

## 2012-06-30 DIAGNOSIS — K219 Gastro-esophageal reflux disease without esophagitis: Secondary | ICD-10-CM

## 2012-06-30 MED ORDER — KETOCONAZOLE 2 % EX SHAM: MEDICATED_SHAMPOO | CUTANEOUS | Status: DC

## 2012-06-30 MED ORDER — NAPROXEN 500 MG PO TABS: 500.0000 mg | ORAL_TABLET | Freq: Two times a day (BID) | ORAL | Status: DC

## 2012-06-30 MED ORDER — OMEPRAZOLE 20 MG PO CPDR: 20.0000 mg | DELAYED_RELEASE_CAPSULE | Freq: Every day | ORAL | Status: DC

## 2012-06-30 MED ORDER — METHOCARBAMOL 500 MG PO TABS: 500.0000 mg | ORAL_TABLET | Freq: Three times a day (TID) | ORAL | Status: DC | PRN
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
## 2013-12-16 ENCOUNTER — Emergency Department (INDEPENDENT_AMBULATORY_CARE_PROVIDER_SITE_OTHER)
Admission: EM | Admit: 2013-12-16 | Discharge: 2013-12-16 | Disposition: A | Discharge status: Home / Self Care | Payer: Medicare Other | Source: Home / Self Care | Attending: Emergency Medicine | Admitting: Emergency Medicine

## 2013-12-16 ENCOUNTER — Encounter (HOSPITAL_COMMUNITY): Payer: Self-pay | Admitting: Emergency Medicine

## 2013-12-16 DIAGNOSIS — L259 Unspecified contact dermatitis, unspecified cause: Secondary | ICD-10-CM | POA: Diagnosis not present

## 2013-12-16 DIAGNOSIS — L309 Dermatitis, unspecified: Secondary | ICD-10-CM

## 2013-12-16 HISTORY — DX: Gastro-esophageal reflux disease without esophagitis: K21.9

## 2013-12-16 HISTORY — DX: Accidental discharge from unspecified firearms or gun, initial encounter: W34.00XA

## 2013-12-16 HISTORY — DX: Laceration without foreign body of abdominal wall, unspecified quadrant without penetration into peritoneal cavity, initial encounter: S31.119A

## 2013-12-16 MED ORDER — PREDNISONE 20 MG PO TABS: ORAL_TABLET | ORAL | Status: DC

## 2013-12-16 MED ORDER — DIPHENHYDRAMINE HCL 50 MG/ML IJ SOLN
25.0000 mg | Freq: Once | INTRAMUSCULAR | Status: AC
  Administered 2013-12-16: 25 mg via INTRAMUSCULAR

## 2013-12-16 MED ORDER — DIPHENHYDRAMINE HCL 50 MG/ML IJ SOLN
INTRAMUSCULAR | Status: AC
  Filled 2013-12-16: qty 1

## 2013-12-16 MED ORDER — HYDROXYZINE HCL 25 MG PO TABS: 25.0000 mg | ORAL_TABLET | Freq: Three times a day (TID) | ORAL | Status: DC | PRN

## 2013-12-16 MED ORDER — MINOCYCLINE HCL 100 MG PO CAPS: 100.0000 mg | ORAL_CAPSULE | Freq: Two times a day (BID) | ORAL | Status: DC

## 2013-12-16 NOTE — ED Provider Notes (Signed)
  Chief Complaint    Chief Complaint  Patient presents with  . Rash    History of Present Illness      Ethan STOVALL Sr. is a 53 year old male who has had a four-day history of a pruritic rash on his face, trunk, proximal arms, and proximal legs. He thinks he may have been exposed to poison ivy. The rash is not painful but itchy. He denies any fever or chills. He's not been exposed to any other obvious allergens or antigens. No new foods or medications. He has no URI symptoms and no difficulty breathing.  Review of Systems   Other than as noted above, the patient denies any of the following symptoms: Systemic:  No fever or chills. ENT:  No nasal congestion, rhinorrhea, sore throat, swelling of lips, tongue or throat. Resp:  No cough, wheezing, or shortness of breath.  [place]    Past medical history, family history, social history, meds, and allergies were reviewed.   Physical Exam     Vital signs:  BP 133/81  Pulse 76  Temp(Src) 98 F (36.7 C) (Oral)  Resp 18  SpO2 99% Gen:  Alert, oriented, in no distress. ENT:  Pharynx clear, no intraoral lesions, moist mucous membranes. Lungs:  Clear to auscultation. Skin:  There were numerous erythematous maculopapules on the face, neck, trunk, and proximal extremities. None distal to the elbows and none on the lower extremities. Some of these were pustular, none were vesicular. They do not appear to be distributed in patches or streaks.  Course in Urgent [place]     The following meds were given:  Medications  diphenhydrAMINE (BENADRYL) injection 25 mg (25 mg Intramuscular Given 12/16/13 1709)   Assessment    The encounter diagnosis was Dermatitis.  This does not look like poison ivy. It may be some type of contact dermatitis or could be a folliculitis. It doesn't look like chickenpox or any other viral exanthem.  Plan     1.  Meds:  The following meds were prescribed:   Discharge Medication List as of 12/16/2013  5:09 PM     START taking these medications   Details  hydrOXYzine (ATARAX/VISTARIL) 25 MG tablet Take 1 tablet (25 mg total) by mouth every 8 (eight) hours as needed for itching., Starting 12/16/2013, Until Discontinued, Normal    minocycline (MINOCIN,DYNACIN) 100 MG capsule Take 1 capsule (100 mg total) by mouth 2 (two) times daily., Starting 12/16/2013, Until Discontinued, Normal    predniSONE (DELTASONE) 20 MG tablet Take 3 daily for 5 days, 2 daily for 5 days, 1 daily for 5 days., Normal        2.  Patient Education/Counseling:  The patient was given appropriate handouts, self care instructions, and instructed in symptomatic relief.  Instructed to use calamine lotion or Aveeno oatmeal bath for itching. If no better by next week, we'll need to see Dr. Nena Polio.  3.  Follow up:  The patient was told to follow up here if no better in 3 to 4 days, or sooner if becoming worse in any way, and given some red flag symptoms such as worsening rash, fever, or difficulty breathing which would prompt immediate return.  Follow up here if necessary.      Harden Mo, MD 12/16/13 [phone]

## 2013-12-16 NOTE — Discharge Instructions (Signed)

## 2013-12-16 NOTE — ED Notes (Signed)
C/O pruritic rash to entire body x 3 days.  Has tried several OTC treatments without relief.

## 2014-12-27 ENCOUNTER — Emergency Department (INDEPENDENT_AMBULATORY_CARE_PROVIDER_SITE_OTHER)
Admission: EM | Admit: 2014-12-27 | Discharge: 2014-12-27 | Disposition: A | Discharge status: Home / Self Care | Payer: Medicare Other | Source: Home / Self Care | Attending: Family Medicine | Admitting: Family Medicine

## 2014-12-27 ENCOUNTER — Encounter (HOSPITAL_COMMUNITY): Payer: Self-pay | Admitting: Emergency Medicine

## 2014-12-27 DIAGNOSIS — K219 Gastro-esophageal reflux disease without esophagitis: Secondary | ICD-10-CM | POA: Diagnosis not present

## 2014-12-27 DIAGNOSIS — M79604 Pain in right leg: Secondary | ICD-10-CM | POA: Diagnosis not present

## 2014-12-27 DIAGNOSIS — Z8781 Personal history of (healed) traumatic fracture: Secondary | ICD-10-CM | POA: Diagnosis not present

## 2014-12-27 DIAGNOSIS — W3400XS Accidental discharge from unspecified firearms or gun, sequela: Secondary | ICD-10-CM

## 2014-12-27 DIAGNOSIS — M79605 Pain in left leg: Secondary | ICD-10-CM

## 2014-12-27 MED ORDER — NAPROXEN 375 MG PO TABS: 375.0000 mg | ORAL_TABLET | Freq: Two times a day (BID) | ORAL | Status: DC

## 2014-12-27 MED ORDER — SUCRALFATE 1 G PO TABS: 1.0000 g | ORAL_TABLET | Freq: Three times a day (TID) | ORAL | Status: DC

## 2014-12-27 MED ORDER — OMEPRAZOLE 20 MG PO CPDR: 20.0000 mg | DELAYED_RELEASE_CAPSULE | Freq: Every day | ORAL | Status: DC
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
## 2014-12-27 NOTE — Discharge Instructions (Signed)
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
## 2015-04-16 ENCOUNTER — Other Ambulatory Visit (HOSPITAL_COMMUNITY): Payer: Self-pay | Admitting: Specialist

## 2015-04-16 DIAGNOSIS — M79651 Pain in right thigh: Secondary | ICD-10-CM

## 2015-04-19 ENCOUNTER — Encounter (HOSPITAL_COMMUNITY)
Admission: RE | Admit: 2015-04-19 | Discharge: 2015-04-19 | Disposition: A | Discharge status: Home / Self Care | Payer: Medicare Other | Source: Ambulatory Visit | Attending: Specialist | Admitting: Specialist

## 2015-04-19 DIAGNOSIS — M79651 Pain in right thigh: Secondary | ICD-10-CM | POA: Insufficient documentation

## 2015-04-19 MED ORDER — TECHNETIUM TC 99M MEDRONATE IV KIT
25.0000 | PACK | Freq: Once | INTRAVENOUS | Status: AC | PRN
  Administered 2015-04-19: 25 via INTRAVENOUS

## 2015-06-05 ENCOUNTER — Other Ambulatory Visit (HOSPITAL_COMMUNITY): Payer: Self-pay | Admitting: Specialist

## 2015-06-13 MED ORDER — CHLORHEXIDINE GLUCONATE 4 % EX LIQD: 60.0000 mL | Freq: Once | CUTANEOUS | Status: DC

## 2015-06-13 MED ORDER — CEFAZOLIN SODIUM-DEXTROSE 2-3 GM-% IV SOLR
2.0000 g | INTRAVENOUS | Status: AC
  Administered 2015-06-14: 2 g via INTRAVENOUS
  Filled 2015-06-13 (×2): qty 50

## 2015-06-13 NOTE — Progress Notes (Signed)
Called pt and he stated he didn't have enough minutes to do full pre-op call. I gave him time of arrival, where to come, NPO after MN tonight. He was able to tell me that the only medication he is on is Tylenol # 3, so I told him he could take that in AM if needed with a sip of water. Instructed him not to wear jewelry and he informed me that he has an house arrest bracelet on right ankle and that Dr. Louanne Skye is aware. I called OR desk to verify that it was ok for him to have surgery with the ankle bracelet on and she said it was ok.

## 2015-06-14 ENCOUNTER — Ambulatory Visit (HOSPITAL_COMMUNITY): Payer: Medicare Other | Admitting: Anesthesiology

## 2015-06-14 ENCOUNTER — Ambulatory Visit (HOSPITAL_COMMUNITY)
Admission: RE | Admit: 2015-06-14 | Discharge: 2015-06-14 | Disposition: A | Discharge status: Home / Self Care | Payer: Medicare Other | Source: Ambulatory Visit | Attending: Specialist | Admitting: Specialist

## 2015-06-14 ENCOUNTER — Encounter (HOSPITAL_COMMUNITY): Payer: Self-pay | Admitting: *Deleted

## 2015-06-14 ENCOUNTER — Encounter (HOSPITAL_COMMUNITY)
Admission: RE | Disposition: A | Discharge status: Home / Self Care | Payer: Self-pay | Source: Ambulatory Visit | Attending: Specialist

## 2015-06-14 DIAGNOSIS — K219 Gastro-esophageal reflux disease without esophagitis: Secondary | ICD-10-CM | POA: Diagnosis not present

## 2015-06-14 DIAGNOSIS — M1990 Unspecified osteoarthritis, unspecified site: Secondary | ICD-10-CM | POA: Insufficient documentation

## 2015-06-14 DIAGNOSIS — Z472 Encounter for removal of internal fixation device: Secondary | ICD-10-CM | POA: Diagnosis present

## 2015-06-14 DIAGNOSIS — F17210 Nicotine dependence, cigarettes, uncomplicated: Secondary | ICD-10-CM | POA: Insufficient documentation

## 2015-06-14 DIAGNOSIS — Z969 Presence of functional implant, unspecified: Secondary | ICD-10-CM

## 2015-06-14 HISTORY — DX: Unspecified osteoarthritis, unspecified site: M19.90

## 2015-06-14 HISTORY — PX: HARDWARE REMOVAL: SHX979

## 2015-06-14 HISTORY — DX: Personal history of other medical treatment: Z92.89

## 2015-06-14 HISTORY — DX: Reserved for inherently not codable concepts without codable children: IMO0001

## 2015-06-14 LAB — CBC
HCT: 46.8 % (ref 39.0–52.0)
Hemoglobin: 15.7 g/dL (ref 13.0–17.0)
MCH: 31.7 pg (ref 26.0–34.0)
MCHC: 33.5 g/dL (ref 30.0–36.0)
MCV: 94.4 fL (ref 78.0–100.0)
PLATELETS: 179 10*3/uL (ref 150–400)
RBC: 4.96 MIL/uL (ref 4.22–5.81)
RDW: 12.2 % (ref 11.5–15.5)
WBC: 3.4 10*3/uL — ABNORMAL LOW (ref 4.0–10.5)

## 2015-06-14 LAB — COMPREHENSIVE METABOLIC PANEL
ALT: 56 U/L (ref 17–63)
ANION GAP: 11 (ref 5–15)
AST: 65 U/L — ABNORMAL HIGH (ref 15–41)
Albumin: 4.1 g/dL (ref 3.5–5.0)
Alkaline Phosphatase: 65 U/L (ref 38–126)
BUN: 12 mg/dL (ref 6–20)
CHLORIDE: 104 mmol/L (ref 101–111)
CO2: 24 mmol/L (ref 22–32)
Calcium: 9.3 mg/dL (ref 8.9–10.3)
Creatinine, Ser: 1.35 mg/dL — ABNORMAL HIGH (ref 0.61–1.24)
GFR calc non Af Amer: 58 mL/min — ABNORMAL LOW (ref >60)
GLUCOSE: 88 mg/dL (ref 65–99)
POTASSIUM: 4 mmol/L (ref 3.5–5.1)
SODIUM: 139 mmol/L (ref 135–145)
Total Bilirubin: 0.6 mg/dL (ref 0.3–1.2)
Total Protein: 7.1 g/dL (ref 6.5–8.1)

## 2015-06-14 LAB — SURGICAL PCR SCREEN
MRSA, PCR: NEGATIVE
Staphylococcus aureus: NEGATIVE

## 2015-06-14 SURGERY — REMOVAL, HARDWARE
Anesthesia: General | Site: Ankle | Laterality: Left

## 2015-06-14 MED ORDER — PHENYLEPHRINE 40 MCG/ML (10ML) SYRINGE FOR IV PUSH (FOR BLOOD PRESSURE SUPPORT)
PREFILLED_SYRINGE | INTRAVENOUS | Status: AC
  Filled 2015-06-14: qty 10

## 2015-06-14 MED ORDER — PROPOFOL 10 MG/ML IV BOLUS
INTRAVENOUS | Status: AC
  Filled 2015-06-14: qty 20

## 2015-06-14 MED ORDER — LACTATED RINGERS IV SOLN
INTRAVENOUS | Status: DC
  Administered 2015-06-14: 11:00:00 via INTRAVENOUS

## 2015-06-14 MED ORDER — HYDRALAZINE HCL 20 MG/ML IJ SOLN
INTRAMUSCULAR | Status: AC
  Filled 2015-06-14: qty 1

## 2015-06-14 MED ORDER — PROPOFOL 10 MG/ML IV BOLUS
INTRAVENOUS | Status: DC | PRN
  Administered 2015-06-14: 200 mg via INTRAVENOUS

## 2015-06-14 MED ORDER — MUPIROCIN 2 % EX OINT
TOPICAL_OINTMENT | CUTANEOUS | Status: AC
  Filled 2015-06-14: qty 22

## 2015-06-14 MED ORDER — DEXAMETHASONE SODIUM PHOSPHATE 4 MG/ML IJ SOLN
INTRAMUSCULAR | Status: AC
  Filled 2015-06-14: qty 2

## 2015-06-14 MED ORDER — MUPIROCIN 2 % EX OINT
1.0000 "application " | TOPICAL_OINTMENT | Freq: Once | CUTANEOUS | Status: AC
  Administered 2015-06-14: 1 via TOPICAL

## 2015-06-14 MED ORDER — BUPIVACAINE HCL (PF) 0.5 % IJ SOLN
INTRAMUSCULAR | Status: AC
  Filled 2015-06-14: qty 30

## 2015-06-14 MED ORDER — DEXAMETHASONE SODIUM PHOSPHATE 4 MG/ML IJ SOLN
INTRAMUSCULAR | Status: DC | PRN
  Administered 2015-06-14: 8 mg via INTRAVENOUS

## 2015-06-14 MED ORDER — MIDAZOLAM HCL 2 MG/2ML IJ SOLN
INTRAMUSCULAR | Status: AC
  Filled 2015-06-14: qty 2

## 2015-06-14 MED ORDER — HYDROCODONE-ACETAMINOPHEN 5-325 MG PO TABS: 1.0000 | ORAL_TABLET | Freq: Four times a day (QID) | ORAL | Status: DC | PRN

## 2015-06-14 MED ORDER — MIDAZOLAM HCL 5 MG/5ML IJ SOLN
INTRAMUSCULAR | Status: DC | PRN
  Administered 2015-06-14: 2 mg via INTRAVENOUS

## 2015-06-14 MED ORDER — ONDANSETRON HCL 4 MG/2ML IJ SOLN
INTRAMUSCULAR | Status: DC | PRN
  Administered 2015-06-14: 4 mg via INTRAVENOUS

## 2015-06-14 MED ORDER — FENTANYL CITRATE (PF) 100 MCG/2ML IJ SOLN
INTRAMUSCULAR | Status: DC | PRN
  Administered 2015-06-14: 100 ug via INTRAVENOUS
  Administered 2015-06-14: 50 ug via INTRAVENOUS
  Administered 2015-06-14: 100 ug via INTRAVENOUS

## 2015-06-14 MED ORDER — FENTANYL CITRATE (PF) 100 MCG/2ML IJ SOLN
INTRAMUSCULAR | Status: AC
  Filled 2015-06-14: qty 2

## 2015-06-14 MED ORDER — FENTANYL CITRATE (PF) 250 MCG/5ML IJ SOLN
INTRAMUSCULAR | Status: AC
  Filled 2015-06-14: qty 5

## 2015-06-14 MED ORDER — BUPIVACAINE HCL (PF) 0.5 % IJ SOLN
INTRAMUSCULAR | Status: DC | PRN
  Administered 2015-06-14: 10 mL

## 2015-06-14 MED ORDER — 0.9 % SODIUM CHLORIDE (POUR BTL) OPTIME
TOPICAL | Status: DC | PRN
  Administered 2015-06-14: 1000 mL

## 2015-06-14 MED ORDER — HYDROMORPHONE HCL 1 MG/ML IJ SOLN: 0.2500 mg | INTRAMUSCULAR | Status: DC | PRN

## 2015-06-14 MED ORDER — ASPIRIN EC 325 MG PO TBEC: 325.0000 mg | DELAYED_RELEASE_TABLET | Freq: Two times a day (BID) | ORAL | Status: AC

## 2015-06-14 MED ORDER — LIDOCAINE HCL (CARDIAC) 20 MG/ML IV SOLN
INTRAVENOUS | Status: DC | PRN
  Administered 2015-06-14: 60 mg via INTRAVENOUS
  Administered 2015-06-14: 40 mg via INTRAVENOUS

## 2015-06-14 MED ORDER — LACTATED RINGERS IV SOLN
INTRAVENOUS | Status: DC | PRN
  Administered 2015-06-14: 11:00:00 via INTRAVENOUS

## 2015-06-14 MED ORDER — HYDRALAZINE HCL 20 MG/ML IJ SOLN
10.0000 mg | Freq: Once | INTRAMUSCULAR | Status: AC
  Administered 2015-06-14: 10 mg via INTRAVENOUS

## 2015-06-14 SURGICAL SUPPLY — 56 items
ADH SKN CLS LQ APL DERMABOND (GAUZE/BANDAGES/DRESSINGS) ×1
BANDAGE ELASTIC 4 VELCRO ST LF (GAUZE/BANDAGES/DRESSINGS) ×2 IMPLANT
BANDAGE ELASTIC 6 VELCRO ST LF (GAUZE/BANDAGES/DRESSINGS) ×2 IMPLANT
BANDAGE ESMARK 6X9 LF (GAUZE/BANDAGES/DRESSINGS) IMPLANT
BNDG CMPR 9X6 STRL LF SNTH (GAUZE/BANDAGES/DRESSINGS)
BNDG COHESIVE 4X5 TAN STRL (GAUZE/BANDAGES/DRESSINGS) IMPLANT
BNDG ESMARK 6X9 LF (GAUZE/BANDAGES/DRESSINGS)
BNDG GAUZE ELAST 4 BULKY (GAUZE/BANDAGES/DRESSINGS) ×3 IMPLANT
COVER SURGICAL LIGHT HANDLE (MISCELLANEOUS) ×3 IMPLANT
CUFF TOURNIQUET SINGLE 18IN (TOURNIQUET CUFF) ×3 IMPLANT
CUFF TOURNIQUET SINGLE 24IN (TOURNIQUET CUFF) IMPLANT
CUFF TOURNIQUET SINGLE 34IN LL (TOURNIQUET CUFF) IMPLANT
CUFF TOURNIQUET SINGLE 44IN (TOURNIQUET CUFF) IMPLANT
DERMABOND ADHESIVE PROPEN (GAUZE/BANDAGES/DRESSINGS) ×2
DERMABOND ADVANCED .7 DNX6 (GAUZE/BANDAGES/DRESSINGS) IMPLANT
DRAPE C-ARM 42X72 X-RAY (DRAPES) IMPLANT
DRAPE EXTREMITY T 121X128X90 (DRAPE) IMPLANT
DRAPE INCISE IOBAN 66X45 STRL (DRAPES) IMPLANT
DRAPE ORTHO SPLIT 77X108 STRL (DRAPES)
DRAPE PROXIMA HALF (DRAPES) IMPLANT
DRAPE SURG ORHT 6 SPLT 77X108 (DRAPES) IMPLANT
DRAPE U-SHAPE 47X51 STRL (DRAPES) ×3 IMPLANT
DRSG EMULSION OIL 3X3 NADH (GAUZE/BANDAGES/DRESSINGS) ×3 IMPLANT
DRSG PAD ABDOMINAL 8X10 ST (GAUZE/BANDAGES/DRESSINGS) ×3 IMPLANT
DURAPREP 26ML APPLICATOR (WOUND CARE) ×3 IMPLANT
ELECT REM PT RETURN 9FT ADLT (ELECTROSURGICAL) ×3
ELECTRODE REM PT RTRN 9FT ADLT (ELECTROSURGICAL) ×1 IMPLANT
GAUZE SPONGE 4X4 12PLY STRL (GAUZE/BANDAGES/DRESSINGS) ×3 IMPLANT
GLOVE BIO SURGEON STRL SZ7.5 (GLOVE) ×2 IMPLANT
GLOVE BIOGEL M STRL SZ7.5 (GLOVE) ×2 IMPLANT
GLOVE BIOGEL PI IND STRL 8 (GLOVE) ×1 IMPLANT
GLOVE BIOGEL PI INDICATOR 8 (GLOVE) ×2
GLOVE ECLIPSE 9.0 STRL (GLOVE) ×3 IMPLANT
GLOVE ORTHO TXT STRL SZ7.5 (GLOVE) ×3 IMPLANT
GLOVE SURG 8.5 LATEX PF (GLOVE) ×3 IMPLANT
GOWN STRL REUS W/ TWL LRG LVL3 (GOWN DISPOSABLE) ×1 IMPLANT
GOWN STRL REUS W/TWL 2XL LVL3 (GOWN DISPOSABLE) ×4 IMPLANT
GOWN STRL REUS W/TWL LRG LVL3 (GOWN DISPOSABLE) ×3
KIT BASIN OR (CUSTOM PROCEDURE TRAY) ×3 IMPLANT
KIT ROOM TURNOVER OR (KITS) ×3 IMPLANT
MANIFOLD NEPTUNE II (INSTRUMENTS) ×1 IMPLANT
NS IRRIG 1000ML POUR BTL (IV SOLUTION) ×3 IMPLANT
PACK GENERAL/GYN (CUSTOM PROCEDURE TRAY) ×3 IMPLANT
PAD ARMBOARD 7.5X6 YLW CONV (MISCELLANEOUS) ×6 IMPLANT
PAD CAST 4YDX4 CTTN HI CHSV (CAST SUPPLIES) ×1 IMPLANT
PADDING CAST COTTON 4X4 STRL (CAST SUPPLIES) ×6
STAPLER VISISTAT 35W (STAPLE) ×3 IMPLANT
STOCKINETTE IMPERVIOUS 9X36 MD (GAUZE/BANDAGES/DRESSINGS) IMPLANT
SUT ETHILON 4 0 FS 1 (SUTURE) IMPLANT
SUT VIC AB 0 CT1 27 (SUTURE)
SUT VIC AB 0 CT1 27XBRD ANBCTR (SUTURE) IMPLANT
SUT VIC AB 2-0 CT1 27 (SUTURE)
SUT VIC AB 2-0 CT1 TAPERPNT 27 (SUTURE) IMPLANT
TOWEL OR 17X24 6PK STRL BLUE (TOWEL DISPOSABLE) ×3 IMPLANT
TOWEL OR 17X26 10 PK STRL BLUE (TOWEL DISPOSABLE) ×3 IMPLANT
WATER STERILE IRR 1000ML POUR (IV SOLUTION) ×3 IMPLANT

## 2015-06-14 NOTE — OR Nursing (Signed)
Patient's ankle bracelet on right foot was padding to prevent potential injury during surgery.  Patient aware of this.

## 2015-06-14 NOTE — Progress Notes (Signed)
Orthopedic Tech Progress Note Patient Details:  Ethan SCHOLLE Sr. [birth year] [id number]  Ortho Devices Type of Ortho Device: Postop shoe/boot Ortho Device/Splint Location: lle Ortho Device/Splint Interventions: Ordered, Application   Karolee Stamps 06/14/2015, 4:25 PM

## 2015-06-14 NOTE — Anesthesia Preprocedure Evaluation (Addendum)
Anesthesia Evaluation  Patient identified by MRN, date of birth, ID band Patient awake    Reviewed: Allergy & Precautions, H&P , NPO status , Patient's Chart, lab work & pertinent test results  Airway Mallampati: II  TM Distance: >3 FB Neck ROM: Full    Dental no notable dental hx. (+) Poor Dentition, Dental Advisory Given   Pulmonary Current Smoker,    Pulmonary exam normal breath sounds clear to auscultation       Cardiovascular negative cardio ROS   Rhythm:Regular Rate:Normal     Neuro/Psych negative neurological ROS  negative psych ROS   GI/Hepatic Neg liver ROS, GERD  Controlled,  Endo/Other  negative endocrine ROS  Renal/GU negative Renal ROS  negative genitourinary   Musculoskeletal  (+) Arthritis , Osteoarthritis,    Abdominal   Peds  Hematology negative hematology ROS (+)   Anesthesia Other Findings   Reproductive/Obstetrics negative OB ROS                            Anesthesia Physical Anesthesia Plan  ASA: II  Anesthesia Plan: General   Post-op Pain Management:    Induction: Intravenous  Airway Management Planned: LMA  Additional Equipment:   Intra-op Plan:   Post-operative Plan: Extubation in OR  Informed Consent: I have reviewed the patients History and Physical, chart, labs and discussed the procedure including the risks, benefits and alternatives for the proposed anesthesia with the patient or authorized representative who has indicated his/her understanding and acceptance.   Dental advisory given  Plan Discussed with: CRNA  Anesthesia Plan Comments:         Anesthesia Quick Evaluation

## 2015-06-14 NOTE — Op Note (Signed)
     06/14/2015  3:21 PM  PATIENT:  Ethan TERPSTRA Sr.  54 y.o. male  MRN: [id number]  OPERATIVE REPORT  PRE-OPERATIVE DIAGNOSIS:  retained hardware left ankle  POST-OPERATIVE DIAGNOSIS:  retained hardware left ankle  PROCEDURE:  Procedure(s): Removal of hardware left ankle lateral malleolus plate and screws    SURGEON:  Jessy Oto, MD     ANESTHESIA:  General, supplemented with local infiltration of Marcaine half percent plain total 10 mL.  Dr. Oren Bracket    COMPLICATIONS:  None.  TOURNIQUET: 36 minutes at 250 mmHg     COMPONENTS: 5x3.5 mm screws and a 5 hole one third semitubular plate were removed and will be returned to the patient.  PROCEDURE: The patient was met in the holding area, and the appropriate left ankle identified and marked with an "X" and my initials.   The patient was then transported to OR and was placed on the operative table in a supine position. The patient was then placed under general anesthesia without difficulty. The patient received appropriate preoperative antibiotic prophylaxis ancef 2 gram.  Tourniquet was applied to the operative thigh. Left leg was then prepped using sterile conditions and draped using sterile technique. Time-out procedure was called and correct.   Theleft leg was elevated and Esmarch exsanguinated with a left calf  tourniquet elevated ot 228mHg.an incision was made over the lateral aspect of the left ankle inline with the old incision scar over a length of about 8 cm. Incision through skin and subcutaneous layers strictly down to plate overlying the lateral aspect of the left distal fibula. Incision then made onto the plate laterally and the soft tissue elevated over the one third semitubular plate laterally and anterior exposing the 5-hole plate and also an anterior screw just anterior on the fibula to the middle third screw of the plate. Small fragment screwdriver then used to remove each of the  individual screws and the plate was lifted off the bone using periosteal elevator. The areas of hypertrophic bone and scar were debrided at each of the screw holes using a hand rongeur.irrigation was carried out over the length of the incision with copious amounts of irrigant solution. The incision was then closed approximating the deep layers with interrupted 0 Vicryl sutures more superficial layers with interrupted 2-0 Vicryl sutures and the skin closed with a running 3-0 Vicryl subcuticular suture. Dermabond was then applied to the area of the skin at the end of the case. Subcutaneous layers of skin were infiltrated with Marcaine half percent plain in a field-like pattern about the left lateral malleolus on the anterior incision line and posterior superior and inferior areas of the incision line. Total of 10 mL was used. Irrigation was carried out over the incisions. A dry dressing was applied to the left lateral ankle incision sites using Adaptic 4 x 4's ABDs pad affixed to the leg then with sterile webril. Then following the removal of the tourniquet the dressing was carried up the left leg to below the knee and a well-padded left short leg synthetic cast was applied with the ankle at 90. Patient was then reactivated and returned to recovery room in satisfactory condition all instrument and sponge counts were correct. This patient's surgery was nearly 2 hours in length, the hardware removed from the right lateral ankle through a separate incision requires 20 minutes of OR time    Ethan Martinez  06/14/2015, 3:21 PM
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
## 2015-06-14 NOTE — Anesthesia Procedure Notes (Addendum)
Procedure Name: LMA Insertion Date/Time: 06/14/2015 2:05 PM Performed by: Jacquiline Doe A Pre-anesthesia Checklist: Patient identified, Emergency Drugs available, Suction available and Patient being monitored Patient Re-evaluated:Patient Re-evaluated prior to inductionOxygen Delivery Method: Circle system utilized Preoxygenation: Pre-oxygenation with 100% oxygen Intubation Type: IV induction Ventilation: Mask ventilation without difficulty LMA: LMA inserted LMA Size: 4.0 Tube type: Oral Number of attempts: 1 Placement Confirmation: positive ETCO2 and breath sounds checked- equal and bilateral Tube secured with: Tape Dental Injury: Teeth and Oropharynx as per pre-operative assessment

## 2015-06-14 NOTE — Transfer of Care (Signed)
Immediate Anesthesia Transfer of Care Note  Patient: Ethan KISE Sr.  Procedure(s) Performed: Procedure(s): Removal of hardware left ankle lateral malleolus plate and screws (Left)  Patient Location: PACU  Anesthesia Type:General  Level of Consciousness: awake, oriented, sedated, patient cooperative and responds to stimulation  Airway & Oxygen Therapy: Patient Spontanous Breathing and Patient connected to nasal cannula oxygen  Post-op Assessment: Report given to RN, Post -op Vital signs reviewed and stable, Patient moving all extremities and Patient moving all extremities X 4  Post vital signs: Reviewed and stable  Last Vitals:  Filed Vitals:   06/14/15 1021  BP: 139/87  Pulse: 77  Temp: 36.7 C  Resp: 20    Complications: No apparent anesthesia complications

## 2015-06-14 NOTE — Brief Op Note (Signed)
06/14/2015  3:19 PM  PATIENT:  Ethan Rink Sr.  54 y.o. male  PRE-OPERATIVE DIAGNOSIS:  retained hardware left ankle  POST-OPERATIVE DIAGNOSIS:  retained hardware left ankle  PROCEDURE:  Procedure(s): Removal of hardware left ankle lateral malleolus plate and screws (Left)  SURGEON:  Surgeon(s) and Role:    * Jessy Oto, MD - Primary   ANESTHESIA:   local and general Dr. Oren Bracket.  EBL:  Total I/O In: 1000 [I.V.:1000] Out: -   BLOOD ADMINISTERED:none  DRAINS: none   LOCAL MEDICATIONS USED:  MARCAINE 0.5 % Amount:10 ml  SPECIMEN:  No Specimen  DISPOSITION OF SPECIMEN:  N/A  COUNTS:  YES  TOURNIQUET:   Total Tourniquet Time Documented: Calf (Left) - 36 minutes Total: Calf (Left) - 36 minutes   DICTATION: .Viviann Spare Dictation  PLAN OF CARE: Discharge to home after PACU  PATIENT DISPOSITION:  PACU - hemodynamically stable.   Delay start of Pharmacological VTE agent (>24hrs) due to surgical blood loss or risk of bleeding: not applicable
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
## 2015-06-14 NOTE — Anesthesia Postprocedure Evaluation (Signed)
Anesthesia Post Note  Patient: Ethan MI Sr.  Procedure(s) Performed: Procedure(s) (LRB): Removal of hardware left ankle lateral malleolus plate and screws (Left)  Patient location during evaluation: PACU Anesthesia Type: General Level of consciousness: awake and alert Pain management: pain level controlled Vital Signs Assessment: post-procedure vital signs reviewed and stable Respiratory status: spontaneous breathing, nonlabored ventilation, respiratory function stable and patient connected to nasal cannula oxygen Cardiovascular status: blood pressure returned to baseline and stable Postop Assessment: no signs of nausea or vomiting Anesthetic complications: no    Last Vitals:  Filed Vitals:   06/14/15 1535 06/14/15 1550  BP: 145/102 131/82  Pulse: 71 85  Temp:    Resp: 10 13    Last Pain:  Filed Vitals:   06/14/15 1555  PainSc: 2                  Nelia Rogoff,W. EDMOND

## 2015-06-14 NOTE — Interval H&P Note (Signed)
History and Physical Interval Note:  06/14/2015 1:39 PM  Ethan Lick Achorn Sr.  has presented today for surgery, with the diagnosis of retained hardware left ankle  The various methods of treatment have been discussed with the patient and family. After consideration of risks, benefits and other options for treatment, the patient has consented to  Procedure(s): Removal of hardware left ankle lateral malleolus plate and screws (Left) as a surgical intervention .  The patient's history has been reviewed, patient examined, no change in status, stable for surgery.  I have reviewed the patient's chart and labs.  Questions were answered to the patient's satisfaction.     Gilad Dugger E

## 2015-06-18 ENCOUNTER — Encounter (HOSPITAL_COMMUNITY): Payer: Self-pay | Admitting: Specialist

## 2015-10-23 ENCOUNTER — Encounter (HOSPITAL_COMMUNITY): Payer: Self-pay | Admitting: Family Medicine

## 2015-10-23 ENCOUNTER — Emergency Department (HOSPITAL_COMMUNITY)
Admission: EM | Admit: 2015-10-23 | Discharge: 2015-10-23 | Disposition: A | Discharge status: Home / Self Care | Payer: Medicare Other | Attending: Emergency Medicine | Admitting: Emergency Medicine

## 2015-10-23 DIAGNOSIS — M79651 Pain in right thigh: Secondary | ICD-10-CM | POA: Diagnosis not present

## 2015-10-23 DIAGNOSIS — G8929 Other chronic pain: Secondary | ICD-10-CM | POA: Diagnosis not present

## 2015-10-23 DIAGNOSIS — Z8719 Personal history of other diseases of the digestive system: Secondary | ICD-10-CM | POA: Diagnosis not present

## 2015-10-23 DIAGNOSIS — Z8781 Personal history of (healed) traumatic fracture: Secondary | ICD-10-CM | POA: Insufficient documentation

## 2015-10-23 DIAGNOSIS — M79601 Pain in right arm: Secondary | ICD-10-CM

## 2015-10-23 DIAGNOSIS — Z7982 Long term (current) use of aspirin: Secondary | ICD-10-CM | POA: Diagnosis not present

## 2015-10-23 DIAGNOSIS — M79604 Pain in right leg: Secondary | ICD-10-CM | POA: Diagnosis present

## 2015-10-23 DIAGNOSIS — F17210 Nicotine dependence, cigarettes, uncomplicated: Secondary | ICD-10-CM | POA: Insufficient documentation

## 2015-10-23 DIAGNOSIS — M1990 Unspecified osteoarthritis, unspecified site: Secondary | ICD-10-CM | POA: Diagnosis not present

## 2015-10-23 MED ORDER — ACETAMINOPHEN-CODEINE #3 300-30 MG PO TABS: 1.0000 | ORAL_TABLET | Freq: Four times a day (QID) | ORAL | Status: AC | PRN
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
## 2015-10-23 NOTE — Discharge Instructions (Signed)
Please read and follow all provided instructions.  Your diagnoses today include:  1. Chronic leg pain, right    Tests performed today include:  Vital signs. See below for your results today.   Medications prescribed:   Tylenol #3 - narcotic pain medication  DO NOT drive or perform any activities that require you to be awake and alert because this medicine can make you drowsy. BE VERY CAREFUL not to take multiple medicines containing Tylenol (also called acetaminophen). Doing so can lead to an overdose which can damage your liver and cause liver failure and possibly death.  Take any prescribed medications only as directed.  Home care instructions:   Follow any educational materials contained in this packet  Continue home ibuprofen  Follow R.I.C.E. Protocol:  R - rest your injury   I  - use ice on injury without applying directly to skin  C - compress injury with bandage or splint  E - elevate the injury as much as possible  Follow-up instructions: Please follow-up with your orthopedic physician (bone specialist).   Return instructions:   Please return if your toes or feet are numb or tingling, appear gray or blue, or you have severe pain (also elevate the leg and loosen splint or wrap if you were given one)  Please return to the Emergency Department if you experience worsening symptoms.   Please return if you have any other emergent concerns.  Additional Information:  Your vital signs today were: BP 122/81 mmHg   Pulse 94   Temp(Src) 98.2 F (36.8 C) (Oral)   Resp 18   SpO2 95% If your blood pressure (BP) was elevated above 135/85 this visit, please have this repeated by your doctor within one month. --------------

## 2016-05-11 ENCOUNTER — Telehealth (INDEPENDENT_AMBULATORY_CARE_PROVIDER_SITE_OTHER): Payer: Self-pay | Admitting: Radiology

## 2016-05-11 MED ORDER — TRAMADOL-ACETAMINOPHEN 37.5-325 MG PO TABS: 1.0000 | ORAL_TABLET | Freq: Four times a day (QID) | ORAL | 0 refills | Status: DC | PRN

## 2016-05-12 NOTE — Telephone Encounter (Signed)
Ethan Martinez called again saying he just received a phone call from [facility] and was told his Rx for Meloxitine was ready. He's adament that he does Not want to take this medication due to it making him feel "sick, dizzy, and jittery." He wants to make sure a Rx for IBProfin, Tylenol, and Tramadol are being sent to the pharmacy instead. Please give him a phone call regarding this.  Pt's ph# [phone] THank you.

## 2016-05-12 NOTE — Telephone Encounter (Signed)
IC pt and advised ultracet Rx called in to his pharm, [facility]    Patient is also asking for a Rx of ibuprofen 800 mg to be sent to pharm for him.  Thanks

## 2016-05-12 NOTE — Telephone Encounter (Signed)
Pt called back wondering if the Rx for Tramadol can be sent to his pharmacy. In addition to the Tramadol, he states he needs two other prescriptions as well (Tylenol 800MG  and Hydrocone). Please give him a phone call regarding this and let him know when/if the Rx have been sent to his pharmacy.  Pt's ph# [phone]  Thank you.

## 2016-06-19 ENCOUNTER — Telehealth (INDEPENDENT_AMBULATORY_CARE_PROVIDER_SITE_OTHER): Payer: Self-pay | Admitting: Specialist

## 2016-06-19 NOTE — Telephone Encounter (Signed)
Patient is requesting tramadol refill  Uses [facility] on [address] Cb#: [id number]  -patient is completely out of meds, says he is in a lot of pain. I let him know the [id number] rx policy, and told him a MA will call when its ready

## 2016-06-19 NOTE — Telephone Encounter (Signed)
Can you advise for [name]?

## 2016-06-19 NOTE — Telephone Encounter (Signed)
Called into his pharmacy 

## 2016-06-19 NOTE — Telephone Encounter (Signed)
Ok to refill the same amount as last time

## 2016-07-01 ENCOUNTER — Telehealth (INDEPENDENT_AMBULATORY_CARE_PROVIDER_SITE_OTHER): Payer: Self-pay | Admitting: Specialist

## 2016-07-01 NOTE — Telephone Encounter (Signed)
He has no pathology that requires narcotics, arthritis and old injuries, not a surgical condition. Ethan Martinez

## 2016-07-01 NOTE — Telephone Encounter (Signed)
Patient is requesting a stronger medication, says he cant walk, wants someone to call him.  Cb#: [id number]

## 2016-07-01 NOTE — Telephone Encounter (Signed)
Mr. Anchondo is needing something stronger for pain, he says he can't walk.  Please advise

## 2016-07-02 ENCOUNTER — Telehealth (INDEPENDENT_AMBULATORY_CARE_PROVIDER_SITE_OTHER): Payer: Self-pay | Admitting: Specialist

## 2016-07-02 NOTE — Telephone Encounter (Signed)
I called and left message for patient that his request was denied due to reasons listed below per Dr. Louanne Skye.

## 2016-07-02 NOTE — Telephone Encounter (Signed)
I am not prescribing anything stronger than tramadol or tylenol #3 or #4. There is nothing here that requires further intervention. jen

## 2016-07-02 NOTE — Telephone Encounter (Signed)
Ethan Martinez is requesting something stronger for pain, states his leg is hurting him and keeping him up at night and he has some swelling in the leg. --Please advise

## 2016-07-02 NOTE — Telephone Encounter (Signed)
Patient called advised the tramadol is not working and he need something stronger for the pain in his leg. Patient said his leg is swollen and the pain is keeping him up at night. Patient said he uses the [facility] on [address]. The number to contact patient is [phone]

## 2016-07-03 NOTE — Telephone Encounter (Signed)
He would like to get a refill of the tylenol #3 if possible

## 2016-08-12 ENCOUNTER — Other Ambulatory Visit (INDEPENDENT_AMBULATORY_CARE_PROVIDER_SITE_OTHER): Payer: Self-pay | Admitting: Orthopaedic Surgery

## 2016-08-13 NOTE — Telephone Encounter (Signed)
I called in rx to patients pharmacy.

## 2016-08-31 ENCOUNTER — Other Ambulatory Visit (INDEPENDENT_AMBULATORY_CARE_PROVIDER_SITE_OTHER): Payer: Self-pay | Admitting: Orthopaedic Surgery

## 2016-09-01 ENCOUNTER — Encounter (HOSPITAL_COMMUNITY): Payer: Self-pay

## 2016-09-01 ENCOUNTER — Emergency Department (HOSPITAL_COMMUNITY)
Admission: EM | Admit: 2016-09-01 | Discharge: 2016-09-01 | Disposition: A | Discharge status: Home / Self Care | Payer: Medicare Other | Attending: Emergency Medicine | Admitting: Emergency Medicine

## 2016-09-01 DIAGNOSIS — K0889 Other specified disorders of teeth and supporting structures: Secondary | ICD-10-CM | POA: Diagnosis present

## 2016-09-01 DIAGNOSIS — F17210 Nicotine dependence, cigarettes, uncomplicated: Secondary | ICD-10-CM | POA: Diagnosis not present

## 2016-09-01 DIAGNOSIS — J449 Chronic obstructive pulmonary disease, unspecified: Secondary | ICD-10-CM | POA: Insufficient documentation

## 2016-09-01 MED ORDER — TRAMADOL HCL 50 MG PO TABS: 50.0000 mg | ORAL_TABLET | Freq: Four times a day (QID) | ORAL | 0 refills | Status: DC | PRN

## 2016-09-01 MED ORDER — PENICILLIN V POTASSIUM 500 MG PO TABS: 500.0000 mg | ORAL_TABLET | Freq: Four times a day (QID) | ORAL | 0 refills | Status: AC

## 2016-09-01 NOTE — ED Triage Notes (Signed)
Per pt, Pt is coming from home with complaints of pain to the lower back teeth on the right side. Notes facial swelling, reports slight fever. Pt has no fever today. Pt unable to see dentist due to insurance.

## 2016-09-01 NOTE — Telephone Encounter (Signed)
Please advise 

## 2016-09-01 NOTE — ED Notes (Signed)
C/o right lower jaw pain and swelling for several  Days states he knows his tooth is bad.

## 2016-09-01 NOTE — Discharge Instructions (Signed)
° °  Return to the emergency room for fever, change in vision, redness to the face that rapidly spreads towards the eye, nausea or vomiting, difficulty swallowing or shortness of breath.   Apply warm compresses to jaw throughout the day.   Take your antibiotics as directed and to the end of the course. DO NOT drink alcohol when taking metronidazole, it will make you very sick!   Followup with a dentist is very important for ongoing evaluation and management of recurrent dental pain. Return to emergency department for emergent changing or worsening symptoms."  Low-cost dental clinic: [facility]  at [phone]**   You may also call [facility] If the dentist on-call cannot see you, please use the resources below:   Patients with Medicaid: [place] [address], [place] [address], [phone]  If unable to pay, or uninsured, contact [facility] [phone]) or [facility] [phone] in [place], [place] in [facility]) to become qualified for the adult dental clinic  Other [facility]- [place], [place], [place], [address]    [phone], Ext. [phone]    2nd and 4th Thursday of the month at 6:30am    10 clients each day by appointment, can sometimes see walk-in     patients if someone does not show for an appointment [place], [place], [place], [address]    [phone] [facility]- [address], [place], [place], [address]    [phone]  [place] Department- [phone] [facility] Department- [phone]
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
## 2016-09-01 NOTE — Telephone Encounter (Signed)
Called into pharmacy

## 2017-01-30 ENCOUNTER — Emergency Department (HOSPITAL_COMMUNITY): Payer: Medicare Other

## 2017-01-30 ENCOUNTER — Emergency Department (HOSPITAL_COMMUNITY)
Admission: EM | Admit: 2017-01-30 | Discharge: 2017-01-31 | Disposition: A | Discharge status: Home / Self Care | Payer: Medicare Other | Attending: Emergency Medicine | Admitting: Emergency Medicine

## 2017-01-30 ENCOUNTER — Encounter (HOSPITAL_COMMUNITY): Payer: Self-pay

## 2017-01-30 DIAGNOSIS — S0101XA Laceration without foreign body of scalp, initial encounter: Secondary | ICD-10-CM | POA: Diagnosis not present

## 2017-01-30 DIAGNOSIS — Y929 Unspecified place or not applicable: Secondary | ICD-10-CM | POA: Diagnosis not present

## 2017-01-30 DIAGNOSIS — Z79899 Other long term (current) drug therapy: Secondary | ICD-10-CM | POA: Insufficient documentation

## 2017-01-30 DIAGNOSIS — Y939 Activity, unspecified: Secondary | ICD-10-CM | POA: Insufficient documentation

## 2017-01-30 DIAGNOSIS — Z7982 Long term (current) use of aspirin: Secondary | ICD-10-CM | POA: Diagnosis not present

## 2017-01-30 DIAGNOSIS — Y998 Other external cause status: Secondary | ICD-10-CM | POA: Diagnosis not present

## 2017-01-30 DIAGNOSIS — S42035A Nondisplaced fracture of lateral end of left clavicle, initial encounter for closed fracture: Secondary | ICD-10-CM | POA: Diagnosis not present

## 2017-01-30 DIAGNOSIS — R410 Disorientation, unspecified: Secondary | ICD-10-CM | POA: Diagnosis not present

## 2017-01-30 DIAGNOSIS — F10920 Alcohol use, unspecified with intoxication, uncomplicated: Secondary | ICD-10-CM | POA: Diagnosis not present

## 2017-01-30 DIAGNOSIS — Y908 Blood alcohol level of 240 mg/100 ml or more: Secondary | ICD-10-CM | POA: Diagnosis not present

## 2017-01-30 DIAGNOSIS — W228XXA Striking against or struck by other objects, initial encounter: Secondary | ICD-10-CM | POA: Diagnosis not present

## 2017-01-30 LAB — COMPREHENSIVE METABOLIC PANEL
ALK PHOS: 59 U/L (ref 38–126)
ALT: 41 U/L (ref 17–63)
ANION GAP: 13 (ref 5–15)
AST: 57 U/L — ABNORMAL HIGH (ref 15–41)
Albumin: 4 g/dL (ref 3.5–5.0)
BUN: 8 mg/dL (ref 6–20)
CALCIUM: 8.8 mg/dL — AB (ref 8.9–10.3)
CO2: 20 mmol/L — AB (ref 22–32)
CREATININE: 1.31 mg/dL — AB (ref 0.61–1.24)
Chloride: 109 mmol/L (ref 101–111)
GFR, EST NON AFRICAN AMERICAN: 60 mL/min — AB (ref >60)
Glucose, Bld: 96 mg/dL (ref 65–99)
Potassium: 3.9 mmol/L (ref 3.5–5.1)
SODIUM: 142 mmol/L (ref 135–145)
Total Bilirubin: 1 mg/dL (ref 0.3–1.2)
Total Protein: 6.9 g/dL (ref 6.5–8.1)

## 2017-01-30 LAB — CBC WITH DIFFERENTIAL/PLATELET
BASOS ABS: 0 10*3/uL (ref 0.0–0.1)
BASOS PCT: 0 %
EOS ABS: 0.1 10*3/uL (ref 0.0–0.7)
Eosinophils Relative: 2 %
HEMATOCRIT: 43.1 % (ref 39.0–52.0)
HEMOGLOBIN: 14.8 g/dL (ref 13.0–17.0)
Lymphocytes Relative: 51 %
Lymphs Abs: 1.6 10*3/uL (ref 0.7–4.0)
MCH: 31.4 pg (ref 26.0–34.0)
MCHC: 34.3 g/dL (ref 30.0–36.0)
MCV: 91.5 fL (ref 78.0–100.0)
Monocytes Absolute: 0.3 10*3/uL (ref 0.1–1.0)
Monocytes Relative: 9 %
NEUTROS ABS: 1.2 10*3/uL — AB (ref 1.7–7.7)
NEUTROS PCT: 38 %
Platelets: 170 10*3/uL (ref 150–400)
RBC: 4.71 MIL/uL (ref 4.22–5.81)
RDW: 12.2 % (ref 11.5–15.5)
WBC: 3.1 10*3/uL — AB (ref 4.0–10.5)

## 2017-01-30 LAB — ETHANOL: ALCOHOL ETHYL (B): 322 mg/dL — AB (ref <5)

## 2017-01-30 MED ORDER — LIDOCAINE-EPINEPHRINE-TETRACAINE (LET) SOLUTION: 3.0000 mL | Freq: Once | NASAL | Status: DC

## 2017-01-30 MED ORDER — LIDOCAINE-EPINEPHRINE (PF) 2 %-1:200000 IJ SOLN
20.0000 mL | Freq: Once | INTRAMUSCULAR | Status: AC
  Administered 2017-01-30: 20 mL
  Filled 2017-01-30: qty 20

## 2017-01-30 NOTE — ED Notes (Signed)
Pt provided with turkey sandwich and sprite zero 

## 2017-01-30 NOTE — ED Notes (Signed)
Patient transported to X-ray 
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
## 2017-01-30 NOTE — ED Provider Notes (Signed)
  Physical Exam  BP 110/85   Pulse (!) 115   Temp 98.1 F (36.7 C) (Oral)   Resp 11   SpO2 98%   Physical Exam  ED Course  .Marland KitchenLaceration Repair Date/Time: 01/30/2017 9:07 PM Performed by: Shary Decamp Authorized by: Shary Decamp   Consent:    Consent obtained:  Verbal   Consent given by:  Patient   Risks discussed:  Infection, pain, poor cosmetic result, poor wound healing and nerve damage   Alternatives discussed:  Delayed treatment Anesthesia (see MAR for exact dosages):    Anesthesia method:  Local infiltration   Local anesthetic:  Lidocaine 1% WITH epi Laceration details:    Location:  Scalp   Scalp location:  L parietal   Length (cm):  5 Pre-procedure details:    Preparation:  Patient was prepped and draped in usual sterile fashion Exploration:    Hemostasis achieved with:  Direct pressure   Wound exploration: entire depth of wound probed and visualized   Treatment:    Area cleansed with:  Betadine, Hibiclens and saline   Amount of cleaning:  Extensive   Irrigation solution:  Sterile water Skin repair:    Repair method:  Staples   Number of staples:  8 Approximation:    Approximation:  Close   Vermilion border: well-aligned   Post-procedure details:    Dressing:  Antibiotic ointment   Patient tolerance of procedure:  Tolerated well, no immediate complications .Marland KitchenLaceration Repair Date/Time: 01/30/2017 9:08 PM Performed by: Shary Decamp Authorized by: Shary Decamp   Consent:    Consent obtained:  Verbal   Consent given by:  Patient   Risks discussed:  Infection, pain, poor wound healing, poor cosmetic result, nerve damage and need for additional repair Anesthesia (see MAR for exact dosages):    Anesthesia method:  Local infiltration   Local anesthetic:  Lidocaine 1% WITH epi Laceration details:    Location:  Scalp   Scalp location:  R parietal   Length (cm):  6 Pre-procedure details:    Preparation:  Patient was prepped and draped in usual sterile  fashion Exploration:    Hemostasis achieved with:  Direct pressure   Wound exploration: entire depth of wound probed and visualized   Treatment:    Area cleansed with:  Betadine, Hibiclens and saline   Amount of cleaning:  Extensive   Irrigation solution:  Sterile saline   Irrigation method:  Syringe Skin repair:    Repair method:  Staples   Number of staples:  9 Approximation:    Approximation:  Close   Vermilion border: well-aligned   Post-procedure details:    Dressing:  Antibiotic ointment   Patient tolerance of procedure:  Tolerated well, no immediate complications        Shary Decamp, Hershal Coria 01/30/17 2108    Quintella Reichert, MD 01/30/17 [phone]

## 2017-01-30 NOTE — ED Notes (Signed)
Pt refused to allow EDP to suture lacerations.
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
## 2017-01-30 NOTE — ED Notes (Signed)
Pt stated he didn't need an arm sling and refused to put it on.

## 2017-01-30 NOTE — ED Notes (Signed)
Patient woken up from sleeping.  Groggy.  Asked for [place] sandwich and soda.  Will allow to eat and then patient provided with blue scrubs to change.  Asked patient to ring on call bell before getting out of bed.  Will monitor.
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
## 2017-01-31 DIAGNOSIS — S0101XA Laceration without foreign body of scalp, initial encounter: Secondary | ICD-10-CM | POA: Diagnosis not present

## 2017-01-31 MED ORDER — IBUPROFEN 600 MG PO TABS: 600.0000 mg | ORAL_TABLET | Freq: Three times a day (TID) | ORAL | 0 refills | Status: DC | PRN

## 2017-01-31 MED ORDER — IBUPROFEN 400 MG PO TABS
600.0000 mg | ORAL_TABLET | Freq: Once | ORAL | Status: AC
  Administered 2017-01-31: 600 mg via ORAL
  Filled 2017-01-31: qty 1

## 2017-01-31 MED ORDER — ONDANSETRON 4 MG PO TBDP
4.0000 mg | ORAL_TABLET | Freq: Once | ORAL | Status: AC
  Administered 2017-01-31: 4 mg via ORAL
  Filled 2017-01-31: qty 1

## 2017-01-31 NOTE — ED Notes (Signed)
Patient able to ambulate independetly.  Gait steady but uneven.  Pt continues to state "I'm fine" "I can go".  This RN provided with pain medications, prescriptions, and accompanied to lobby.  Pt given extra [place] sandwich and beverage.

## 2017-01-31 NOTE — ED Notes (Signed)
Patient able to ambulate independently  

## 2017-02-06 ENCOUNTER — Encounter (HOSPITAL_COMMUNITY): Payer: Self-pay | Admitting: Emergency Medicine

## 2017-02-06 ENCOUNTER — Emergency Department (HOSPITAL_COMMUNITY)
Admission: EM | Admit: 2017-02-06 | Discharge: 2017-02-06 | Disposition: A | Discharge status: Home / Self Care | Payer: Medicare Other | Attending: Emergency Medicine | Admitting: Emergency Medicine

## 2017-02-06 DIAGNOSIS — Z79899 Other long term (current) drug therapy: Secondary | ICD-10-CM | POA: Insufficient documentation

## 2017-02-06 DIAGNOSIS — J449 Chronic obstructive pulmonary disease, unspecified: Secondary | ICD-10-CM | POA: Insufficient documentation

## 2017-02-06 DIAGNOSIS — Z4802 Encounter for removal of sutures: Secondary | ICD-10-CM | POA: Insufficient documentation

## 2017-02-06 DIAGNOSIS — F17210 Nicotine dependence, cigarettes, uncomplicated: Secondary | ICD-10-CM | POA: Insufficient documentation

## 2017-02-06 DIAGNOSIS — S0101XD Laceration without foreign body of scalp, subsequent encounter: Secondary | ICD-10-CM | POA: Diagnosis not present

## 2017-02-06 MED ORDER — IBUPROFEN 600 MG PO TABS: 600.0000 mg | ORAL_TABLET | Freq: Four times a day (QID) | ORAL | 0 refills | Status: DC | PRN

## 2017-02-06 NOTE — ED Triage Notes (Signed)
Pt has staples placed in scalp last Saturday and here for removal.
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
## 2017-02-06 NOTE — ED Notes (Signed)
Staples intact to frontal scalp. Appears to be healing well.

## 2017-02-06 NOTE — Discharge Instructions (Signed)
AS DISCUSSED, PLEASE KEEP YOUR LEFT SHOULDER IN A SLING FOR COMFORT, APPLY ICE, IBUPROFEN FOR PAIN. FOLLOW-UP WITH A PRIMARY CARE PROVIDER AT [facility] IF SYMPTOMS PERSIST.  RETURN IF YOU EXPERIENCE REDNESS, WARMTH, SWELLING, PURULENCE AT THE WOUND SITES, FEVER, CHILLS,OR OTHER CONCERNING SYMPTOMS IN THE MEANTIME.

## 2017-11-08 ENCOUNTER — Ambulatory Visit (INDEPENDENT_AMBULATORY_CARE_PROVIDER_SITE_OTHER): Payer: Self-pay

## 2017-11-08 ENCOUNTER — Encounter (INDEPENDENT_AMBULATORY_CARE_PROVIDER_SITE_OTHER): Payer: Self-pay | Admitting: Specialist

## 2017-11-08 ENCOUNTER — Ambulatory Visit (INDEPENDENT_AMBULATORY_CARE_PROVIDER_SITE_OTHER): Payer: Medicare Other | Admitting: Specialist

## 2017-11-08 VITALS — BP 121/84 | HR 89 | Ht 64.0 in | Wt 170.0 lb

## 2017-11-08 DIAGNOSIS — M25512 Pain in left shoulder: Secondary | ICD-10-CM | POA: Diagnosis not present

## 2017-11-08 DIAGNOSIS — M174 Other bilateral secondary osteoarthritis of knee: Secondary | ICD-10-CM

## 2017-11-08 DIAGNOSIS — M79604 Pain in right leg: Secondary | ICD-10-CM

## 2017-11-08 DIAGNOSIS — G8929 Other chronic pain: Secondary | ICD-10-CM | POA: Diagnosis not present

## 2017-11-08 MED ORDER — HYDROCODONE-ACETAMINOPHEN 5-325 MG PO TABS: 1.0000 | ORAL_TABLET | Freq: Four times a day (QID) | ORAL | 0 refills | Status: AC | PRN
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
## 2017-11-08 NOTE — Patient Instructions (Addendum)
  Knee is suffering from osteoarthritis, only real proven treatments are Weight loss, NSIADs like diclofenac and exercise. Well padded shoes help. Ice the knee 2-3 times a day 15-20 mins at a time. Avoid overhead lifting and overhead use of the arms. Do not lift greater than 10 lbs. Tylenol ES one every 6-8 hours for pain and inflamation. Overbook to be seen on Friday for Xray of the left shoulder and for injections of the knee.

## 2017-11-12 ENCOUNTER — Telehealth (INDEPENDENT_AMBULATORY_CARE_PROVIDER_SITE_OTHER): Payer: Self-pay | Admitting: Specialist

## 2017-11-12 ENCOUNTER — Ambulatory Visit (INDEPENDENT_AMBULATORY_CARE_PROVIDER_SITE_OTHER): Payer: Medicare Other | Admitting: Specialist

## 2017-11-12 NOTE — Telephone Encounter (Signed)
I put pt on cancellation list

## 2017-11-12 NOTE — Telephone Encounter (Signed)
Patient was unable to come to appt today due to transportation issues, he said they wouldn't pick him up because of it being a holiday? He said he was supposed to come back in for blood work and he needs an appt as soon as possible. Please advise # [phone]

## 2017-11-16 ENCOUNTER — Other Ambulatory Visit (INDEPENDENT_AMBULATORY_CARE_PROVIDER_SITE_OTHER): Payer: Self-pay | Admitting: Specialist

## 2017-11-16 NOTE — Telephone Encounter (Signed)
Sent request to Dr. [name] 

## 2017-11-16 NOTE — Telephone Encounter (Signed)
Patient called asking for a refill on his hydrocodone, and also wanted to know if he could be put on the cancellation list so he could be seen sooner if possible. CB # [phone]

## 2017-11-16 NOTE — Addendum Note (Signed)
Addended by: Minda Ditto, Alyse Low N on: 11/16/2017 01:19 PM   Modules accepted: Orders

## 2017-11-17 ENCOUNTER — Telehealth (INDEPENDENT_AMBULATORY_CARE_PROVIDER_SITE_OTHER): Payer: Self-pay

## 2017-11-17 NOTE — Telephone Encounter (Signed)
FYI-  Patient called again stating that he would like a Rx stronger than Hydrocodone.  Advised him that we are currently waiting on a response from Dr. Louanne Skye.  Cb# is [phone].

## 2017-11-17 NOTE — Telephone Encounter (Signed)
Pt states that he has another appt to followup, I advised that he nneds to [facility] he can come to that appt to discuss this

## 2017-11-17 NOTE — Telephone Encounter (Signed)
I called and advised patient that rx was denied so he can not get anything any stronger for pain at this time.

## 2017-11-17 NOTE — Telephone Encounter (Signed)
Pt is aware.  

## 2017-11-29 ENCOUNTER — Ambulatory Visit (INDEPENDENT_AMBULATORY_CARE_PROVIDER_SITE_OTHER): Payer: Medicare Other

## 2017-11-29 ENCOUNTER — Encounter (INDEPENDENT_AMBULATORY_CARE_PROVIDER_SITE_OTHER): Payer: Self-pay | Admitting: Specialist

## 2017-11-29 ENCOUNTER — Ambulatory Visit (INDEPENDENT_AMBULATORY_CARE_PROVIDER_SITE_OTHER): Payer: Medicare Other | Admitting: Specialist

## 2017-11-29 VITALS — BP 117/73 | HR 64 | Ht 64.0 in | Wt 170.0 lb

## 2017-11-29 DIAGNOSIS — S42022D Displaced fracture of shaft of left clavicle, subsequent encounter for fracture with routine healing: Secondary | ICD-10-CM | POA: Diagnosis not present

## 2017-11-29 DIAGNOSIS — M7542 Impingement syndrome of left shoulder: Secondary | ICD-10-CM | POA: Diagnosis not present

## 2017-11-29 DIAGNOSIS — M25512 Pain in left shoulder: Secondary | ICD-10-CM

## 2017-11-29 DIAGNOSIS — M174 Other bilateral secondary osteoarthritis of knee: Secondary | ICD-10-CM | POA: Diagnosis not present

## 2017-11-29 DIAGNOSIS — R29898 Other symptoms and signs involving the musculoskeletal system: Secondary | ICD-10-CM

## 2017-11-29 MED ORDER — DICLOFENAC SODIUM 1 % TD GEL: 4.0000 g | Freq: Four times a day (QID) | TRANSDERMAL | 3 refills | Status: AC

## 2017-11-29 MED ORDER — TRAMADOL HCL 50 MG PO TABS: 50.0000 mg | ORAL_TABLET | Freq: Four times a day (QID) | ORAL | 0 refills | Status: AC | PRN

## 2017-11-29 MED ORDER — IBUPROFEN 800 MG PO TABS: 800.0000 mg | ORAL_TABLET | Freq: Three times a day (TID) | ORAL | 3 refills | Status: DC | PRN
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
## 2017-11-29 NOTE — Patient Instructions (Addendum)
  Knee is suffering from osteoarthritis, only real proven treatments are Weight loss, NSIADs like diclofenac and exercise. Well padded shoes help. Ice the knee 2-3 times a day 15-20 mins at a time. Avoid overhead lifting and overhead use of the arms. Do not lift greater than 10 lbs. NSAIDs one for pain and inflamation.  PT for 2-3 weeks for left shoulder and right knee, then a home exercise program. CBD oil is a consideration.

## 2017-12-10 ENCOUNTER — Ambulatory Visit (INDEPENDENT_AMBULATORY_CARE_PROVIDER_SITE_OTHER): Payer: Medicare Other | Admitting: Specialist

## 2017-12-13 ENCOUNTER — Ambulatory Visit: Payer: Medicare Other | Attending: Specialist | Admitting: Physical Therapy

## 2017-12-14 ENCOUNTER — Telehealth (INDEPENDENT_AMBULATORY_CARE_PROVIDER_SITE_OTHER): Payer: Self-pay | Admitting: Specialist

## 2017-12-14 NOTE — Telephone Encounter (Signed)
Patient called triage phone asking for hydrocodone Rx, states Tramadol doesn't help his pain-begging for a call back before 5

## 2017-12-14 NOTE — Telephone Encounter (Signed)
Dr.[name] meant to send it to you.

## 2017-12-14 NOTE — Telephone Encounter (Signed)
I called and advised patient of Dr. [name]'s message 

## 2017-12-14 NOTE — Telephone Encounter (Signed)
Patient called requesting an RX refill on his Hydrocodone.  He stated that the tramadol is not working and needs something stronger.  He also stated that he is cramping real bad.  CB#[phone].  Thank you.

## 2017-12-14 NOTE — Telephone Encounter (Signed)
He has no diagnosis that requires anything stronger, no. jen

## 2017-12-14 NOTE — Telephone Encounter (Signed)
Patient called requesting an RX refill on his Hydrocodone.  He stated that the tramadol is not working and needs something stronger.  He also stated that he is cramping real bad.

## 2018-09-07 ENCOUNTER — Other Ambulatory Visit (INDEPENDENT_AMBULATORY_CARE_PROVIDER_SITE_OTHER): Payer: Self-pay | Admitting: Specialist

## 2018-09-08 NOTE — Telephone Encounter (Signed)
Ibuprofen refill request.

## 2019-03-18 ENCOUNTER — Other Ambulatory Visit (INDEPENDENT_AMBULATORY_CARE_PROVIDER_SITE_OTHER): Payer: Self-pay | Admitting: Specialist

## 2019-05-25 ENCOUNTER — Other Ambulatory Visit: Payer: Self-pay

## 2019-05-25 ENCOUNTER — Ambulatory Visit (HOSPITAL_COMMUNITY)
Admission: EM | Admit: 2019-05-25 | Discharge: 2019-05-25 | Disposition: A | Discharge status: Home / Self Care | Payer: Medicare Other | Attending: Family Medicine | Admitting: Family Medicine

## 2019-05-25 ENCOUNTER — Encounter (HOSPITAL_COMMUNITY): Payer: Self-pay | Admitting: Family Medicine

## 2019-05-25 DIAGNOSIS — I10 Essential (primary) hypertension: Secondary | ICD-10-CM | POA: Diagnosis not present

## 2019-05-25 DIAGNOSIS — L03115 Cellulitis of right lower limb: Secondary | ICD-10-CM | POA: Diagnosis not present

## 2019-05-25 MED ORDER — DOXYCYCLINE HYCLATE 100 MG PO TABS: 100.0000 mg | ORAL_TABLET | Freq: Two times a day (BID) | ORAL | 0 refills | Status: DC

## 2019-05-25 MED ORDER — AMLODIPINE BESYLATE 5 MG PO TABS: 5.0000 mg | ORAL_TABLET | Freq: Every day | ORAL | 3 refills | Status: DC

## 2019-05-25 MED ORDER — CLOTRIMAZOLE-BETAMETHASONE 1-0.05 % EX CREA: TOPICAL_CREAM | CUTANEOUS | 0 refills | Status: DC

## 2019-05-25 NOTE — ED Triage Notes (Signed)
Pt presents with ongoing  skin infection on right leg that he has been dealing with for past few months ; pt states the area is itching with minimal pain but is not healing well.
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
## 2019-05-25 NOTE — Discharge Instructions (Addendum)
Return if not improving in one week

## 2019-06-01 ENCOUNTER — Ambulatory Visit: Payer: Medicare Other | Admitting: Surgery

## 2019-07-24 ENCOUNTER — Encounter: Payer: Self-pay | Admitting: Orthopedic Surgery

## 2019-07-24 ENCOUNTER — Other Ambulatory Visit: Payer: Self-pay

## 2019-07-24 ENCOUNTER — Ambulatory Visit (INDEPENDENT_AMBULATORY_CARE_PROVIDER_SITE_OTHER): Payer: Medicare Other | Admitting: Orthopedic Surgery

## 2019-07-24 VITALS — Ht 64.0 in | Wt 170.0 lb

## 2019-07-24 DIAGNOSIS — B369 Superficial mycosis, unspecified: Secondary | ICD-10-CM

## 2019-07-24 MED ORDER — CLOTRIMAZOLE-BETAMETHASONE 1-0.05 % EX CREA: 1.0000 "application " | TOPICAL_CREAM | Freq: Two times a day (BID) | CUTANEOUS | 3 refills | Status: DC
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
## 2019-10-16 ENCOUNTER — Other Ambulatory Visit: Payer: Self-pay | Admitting: Orthopedic Surgery

## 2020-06-29 ENCOUNTER — Other Ambulatory Visit: Payer: Self-pay | Admitting: Orthopedic Surgery

## 2020-09-02 ENCOUNTER — Telehealth: Payer: Self-pay

## 2020-09-02 NOTE — Telephone Encounter (Signed)
PCP for blood amliodipine. We cant do other as we havent seen him in quite awhile

## 2020-09-02 NOTE — Telephone Encounter (Signed)
I am not sure why we would be refilling amlodipine but we did see for fungal infection for the second med refill request.

## 2020-09-02 NOTE — Telephone Encounter (Signed)
I called and sw pt to advise of message below. He will call his pcp for the amlodipine but made an appt for Friday with Erin to recheck fungal infection

## 2020-09-02 NOTE — Telephone Encounter (Signed)
Patient called he is requesting rx refill for amlodipine and clotrimazole-betamethasone call [phone]

## 2020-09-06 ENCOUNTER — Other Ambulatory Visit: Payer: Self-pay

## 2020-09-06 ENCOUNTER — Ambulatory Visit (INDEPENDENT_AMBULATORY_CARE_PROVIDER_SITE_OTHER): Payer: Medicare HMO | Admitting: Family

## 2020-09-06 DIAGNOSIS — B354 Tinea corporis: Secondary | ICD-10-CM | POA: Diagnosis not present

## 2020-09-06 MED ORDER — TRIAMCINOLONE ACETONIDE 0.1 % EX CREA: TOPICAL_CREAM | Freq: Two times a day (BID) | CUTANEOUS | 1 refills | Status: DC

## 2020-09-06 MED ORDER — AMLODIPINE BESYLATE 5 MG PO TABS: 5.0000 mg | ORAL_TABLET | Freq: Every day | ORAL | 0 refills | Status: DC

## 2020-09-09 ENCOUNTER — Telehealth: Payer: Self-pay | Admitting: Family

## 2020-09-09 ENCOUNTER — Telehealth: Payer: Self-pay | Admitting: Physician Assistant

## 2020-09-09 NOTE — Telephone Encounter (Signed)
You saw this pt last week for fungal infection. Please see below and advise.

## 2020-09-09 NOTE — Telephone Encounter (Signed)
Pt calledand states he was suppose to have two creams sent to. [facility].Wonder

## 2020-09-09 NOTE — Telephone Encounter (Signed)
Pt called wondering if Junie Panning could send his to creams in to the [facility]. CB [phone]

## 2020-09-10 ENCOUNTER — Encounter: Payer: Self-pay | Admitting: Family

## 2020-09-10 MED ORDER — TRIAMCINOLONE ACETONIDE 0.1 % EX CREA: TOPICAL_CREAM | Freq: Every day | CUTANEOUS | 1 refills | Status: DC
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
## 2020-09-10 NOTE — Addendum Note (Signed)
Addended by: Suzan Slick on: 09/10/2020 08:33 AM   Modules accepted: Orders

## 2020-09-16 ENCOUNTER — Telehealth: Payer: Self-pay | Admitting: Family

## 2020-09-16 NOTE — Telephone Encounter (Signed)
Pt called and states the pharmacy needs a new prescription for how to make the cream? He states they don't know how to make it.. Cb [phone]

## 2020-09-17 MED ORDER — KETOCONAZOLE 2 % EX CREA: 1.0000 "application " | TOPICAL_CREAM | Freq: Every day | CUTANEOUS | 0 refills | Status: DC

## 2020-09-17 NOTE — Addendum Note (Signed)
Addended by: Suzan Slick on: 09/17/2020 08:36 AM   Modules accepted: Orders

## 2020-09-18 ENCOUNTER — Telehealth: Payer: Self-pay | Admitting: Specialist

## 2020-09-18 ENCOUNTER — Telehealth: Payer: Self-pay

## 2020-09-18 MED ORDER — CLOTRIMAZOLE-BETAMETHASONE 1-0.05 % EX CREA: 1.0000 "application " | TOPICAL_CREAM | Freq: Two times a day (BID) | CUTANEOUS | 0 refills | Status: DC

## 2020-09-18 MED ORDER — AMLODIPINE BESYLATE 5 MG PO TABS: 5.0000 mg | ORAL_TABLET | Freq: Every day | ORAL | 0 refills | Status: AC

## 2020-09-18 NOTE — Telephone Encounter (Signed)
Patient called needing Rx's also filled for Amlodipine and Clotrimazole. The number to contact patient is [phone]. Please see previous note.

## 2020-09-18 NOTE — Telephone Encounter (Signed)
Patient called he is requesting rx refill for diclofenac due to him having swelling in his leg patient would like a call back confirming rx has been sent to the pharmacy call back:[phone]   Patient is requesting rx to be sent to [facility], [address]  [place], [place] [address]

## 2020-09-19 ENCOUNTER — Telehealth: Payer: Self-pay | Admitting: Family

## 2020-09-19 NOTE — Telephone Encounter (Signed)
Pt called stating he still needs to have his diclofenac called in. He states when he was originally getting it called in; it was for two tubes of cream at a time; pt would like to have that same rx. Pt would like to be notified when this is ready.   [phone]

## 2020-09-24 MED ORDER — CLOTRIMAZOLE-BETAMETHASONE 1-0.05 % EX CREA
1.0000 | TOPICAL_CREAM | Freq: Two times a day (BID) | CUTANEOUS | 0 refills | Status: DC
Start: 2020-09-24 — End: 2020-11-13

## 2020-09-24 MED ORDER — DICLOFENAC SODIUM 1 % EX GEL: 2.0000 g | Freq: Four times a day (QID) | CUTANEOUS | 3 refills | Status: AC | PRN

## 2020-11-12 ENCOUNTER — Telehealth: Payer: Self-pay | Admitting: Physician Assistant

## 2020-11-12 NOTE — Telephone Encounter (Signed)
Patient called requesting refills of Lotrisone topical ointment and Ibuprofen 800. Please send to pharmacy on file. Patient phone number is [phone].

## 2020-11-12 NOTE — Telephone Encounter (Signed)
Pt last in office 09/06/20 please see below and advise.

## 2020-11-13 MED ORDER — IBUPROFEN 800 MG PO TABS: ORAL_TABLET | ORAL | 3 refills | Status: AC

## 2020-11-13 MED ORDER — CLOTRIMAZOLE-BETAMETHASONE 1-0.05 % EX CREA: 1.0000 "application " | TOPICAL_CREAM | Freq: Two times a day (BID) | CUTANEOUS | 0 refills | Status: AC

## 2020-12-10 ENCOUNTER — Inpatient Hospital Stay (HOSPITAL_COMMUNITY): Payer: Medicare HMO

## 2020-12-10 ENCOUNTER — Emergency Department (HOSPITAL_COMMUNITY): Payer: Medicare HMO

## 2020-12-10 ENCOUNTER — Inpatient Hospital Stay (HOSPITAL_COMMUNITY)
Admission: EM | Admit: 2020-12-10 | Discharge: 2020-12-20 | DRG: 917 | Disposition: E | Payer: Medicare HMO | Attending: Pulmonary Disease | Admitting: Pulmonary Disease

## 2020-12-10 DIAGNOSIS — D7589 Other specified diseases of blood and blood-forming organs: Secondary | ICD-10-CM | POA: Diagnosis present

## 2020-12-10 DIAGNOSIS — I468 Cardiac arrest due to other underlying condition: Secondary | ICD-10-CM | POA: Diagnosis present

## 2020-12-10 DIAGNOSIS — F10129 Alcohol abuse with intoxication, unspecified: Secondary | ICD-10-CM | POA: Diagnosis present

## 2020-12-10 DIAGNOSIS — K567 Ileus, unspecified: Secondary | ICD-10-CM | POA: Diagnosis not present

## 2020-12-10 DIAGNOSIS — K219 Gastro-esophageal reflux disease without esophagitis: Secondary | ICD-10-CM | POA: Diagnosis present

## 2020-12-10 DIAGNOSIS — Y92009 Unspecified place in unspecified non-institutional (private) residence as the place of occurrence of the external cause: Secondary | ICD-10-CM | POA: Diagnosis not present

## 2020-12-10 DIAGNOSIS — R7989 Other specified abnormal findings of blood chemistry: Secondary | ICD-10-CM | POA: Diagnosis present

## 2020-12-10 DIAGNOSIS — I959 Hypotension, unspecified: Secondary | ICD-10-CM | POA: Diagnosis present

## 2020-12-10 DIAGNOSIS — I469 Cardiac arrest, cause unspecified: Secondary | ICD-10-CM | POA: Diagnosis present

## 2020-12-10 DIAGNOSIS — Z515 Encounter for palliative care: Secondary | ICD-10-CM | POA: Diagnosis not present

## 2020-12-10 DIAGNOSIS — R001 Bradycardia, unspecified: Secondary | ICD-10-CM | POA: Diagnosis present

## 2020-12-10 DIAGNOSIS — Z79899 Other long term (current) drug therapy: Secondary | ICD-10-CM

## 2020-12-10 DIAGNOSIS — R570 Cardiogenic shock: Secondary | ICD-10-CM | POA: Diagnosis present

## 2020-12-10 DIAGNOSIS — G931 Anoxic brain damage, not elsewhere classified: Secondary | ICD-10-CM | POA: Diagnosis present

## 2020-12-10 DIAGNOSIS — F14129 Cocaine abuse with intoxication, unspecified: Secondary | ICD-10-CM | POA: Diagnosis present

## 2020-12-10 DIAGNOSIS — Z7982 Long term (current) use of aspirin: Secondary | ICD-10-CM

## 2020-12-10 DIAGNOSIS — R0600 Dyspnea, unspecified: Secondary | ICD-10-CM | POA: Diagnosis not present

## 2020-12-10 DIAGNOSIS — F17210 Nicotine dependence, cigarettes, uncomplicated: Secondary | ICD-10-CM | POA: Diagnosis present

## 2020-12-10 DIAGNOSIS — I129 Hypertensive chronic kidney disease with stage 1 through stage 4 chronic kidney disease, or unspecified chronic kidney disease: Secondary | ICD-10-CM | POA: Diagnosis present

## 2020-12-10 DIAGNOSIS — N289 Disorder of kidney and ureter, unspecified: Secondary | ICD-10-CM

## 2020-12-10 DIAGNOSIS — E785 Hyperlipidemia, unspecified: Secondary | ICD-10-CM | POA: Diagnosis present

## 2020-12-10 DIAGNOSIS — E874 Mixed disorder of acid-base balance: Secondary | ICD-10-CM | POA: Diagnosis present

## 2020-12-10 DIAGNOSIS — R008 Other abnormalities of heart beat: Secondary | ICD-10-CM | POA: Diagnosis not present

## 2020-12-10 DIAGNOSIS — Z20822 Contact with and (suspected) exposure to covid-19: Secondary | ICD-10-CM | POA: Diagnosis present

## 2020-12-10 DIAGNOSIS — R4020 Unspecified coma: Secondary | ICD-10-CM | POA: Diagnosis present

## 2020-12-10 DIAGNOSIS — R578 Other shock: Secondary | ICD-10-CM | POA: Diagnosis present

## 2020-12-10 DIAGNOSIS — Z7189 Other specified counseling: Secondary | ICD-10-CM | POA: Diagnosis not present

## 2020-12-10 DIAGNOSIS — R7401 Elevation of levels of liver transaminase levels: Secondary | ICD-10-CM | POA: Diagnosis not present

## 2020-12-10 DIAGNOSIS — G936 Cerebral edema: Secondary | ICD-10-CM | POA: Diagnosis not present

## 2020-12-10 DIAGNOSIS — J189 Pneumonia, unspecified organism: Secondary | ICD-10-CM

## 2020-12-10 DIAGNOSIS — N1831 Chronic kidney disease, stage 3a: Secondary | ICD-10-CM | POA: Diagnosis present

## 2020-12-10 DIAGNOSIS — R780 Finding of alcohol in blood: Secondary | ICD-10-CM | POA: Diagnosis present

## 2020-12-10 DIAGNOSIS — R739 Hyperglycemia, unspecified: Secondary | ICD-10-CM | POA: Diagnosis present

## 2020-12-10 DIAGNOSIS — Y906 Blood alcohol level of 120-199 mg/100 ml: Secondary | ICD-10-CM | POA: Diagnosis present

## 2020-12-10 DIAGNOSIS — T405X1A Poisoning by cocaine, accidental (unintentional), initial encounter: Principal | ICD-10-CM | POA: Diagnosis present

## 2020-12-10 DIAGNOSIS — J9601 Acute respiratory failure with hypoxia: Secondary | ICD-10-CM | POA: Diagnosis present

## 2020-12-10 DIAGNOSIS — G928 Other toxic encephalopathy: Secondary | ICD-10-CM | POA: Diagnosis present

## 2020-12-10 DIAGNOSIS — J9690 Respiratory failure, unspecified, unspecified whether with hypoxia or hypercapnia: Secondary | ICD-10-CM

## 2020-12-10 DIAGNOSIS — J690 Pneumonitis due to inhalation of food and vomit: Secondary | ICD-10-CM | POA: Diagnosis present

## 2020-12-10 DIAGNOSIS — Z66 Do not resuscitate: Secondary | ICD-10-CM | POA: Diagnosis not present

## 2020-12-10 DIAGNOSIS — J449 Chronic obstructive pulmonary disease, unspecified: Secondary | ICD-10-CM | POA: Diagnosis present

## 2020-12-10 DIAGNOSIS — R569 Unspecified convulsions: Secondary | ICD-10-CM | POA: Diagnosis present

## 2020-12-10 DIAGNOSIS — N179 Acute kidney failure, unspecified: Secondary | ICD-10-CM | POA: Diagnosis present

## 2020-12-10 DIAGNOSIS — K7200 Acute and subacute hepatic failure without coma: Secondary | ICD-10-CM | POA: Diagnosis present

## 2020-12-10 DIAGNOSIS — E876 Hypokalemia: Secondary | ICD-10-CM | POA: Diagnosis not present

## 2020-12-10 DIAGNOSIS — G253 Myoclonus: Secondary | ICD-10-CM | POA: Diagnosis present

## 2020-12-10 DIAGNOSIS — Z4659 Encounter for fitting and adjustment of other gastrointestinal appliance and device: Secondary | ICD-10-CM

## 2020-12-10 LAB — COMPREHENSIVE METABOLIC PANEL
ALT: 353 U/L — ABNORMAL HIGH (ref 0–44)
ALT: 555 U/L — ABNORMAL HIGH (ref 0–44)
AST: 668 U/L — ABNORMAL HIGH (ref 15–41)
AST: 1175 U/L — ABNORMAL HIGH (ref 15–41)
Albumin: 3.5 g/dL (ref 3.5–5.0)
Albumin: 4.2 g/dL (ref 3.5–5.0)
Alkaline Phosphatase: 74 U/L (ref 38–126)
Alkaline Phosphatase: 72 U/L (ref 38–126)
Anion gap: 16 — ABNORMAL HIGH (ref 5–15)
Anion gap: 17 — ABNORMAL HIGH (ref 5–15)
BUN: 10 mg/dL (ref 6–20)
BUN: 9 mg/dL (ref 6–20)
CO2: 18 mmol/L — ABNORMAL LOW (ref 22–32)
CO2: 21 mmol/L — ABNORMAL LOW (ref 22–32)
Calcium: 8.6 mg/dL — ABNORMAL LOW (ref 8.9–10.3)
Calcium: 9 mg/dL (ref 8.9–10.3)
Chloride: 103 mmol/L (ref 98–111)
Chloride: 104 mmol/L (ref 98–111)
Creatinine, Ser: 1.41 mg/dL — ABNORMAL HIGH (ref 0.61–1.24)
Creatinine, Ser: 1.11 mg/dL (ref 0.61–1.24)
GFR, Estimated: 57 mL/min — ABNORMAL LOW (ref >60)
GFR, Estimated: >60 mL/min (ref >60)
Glucose, Bld: 308 mg/dL — ABNORMAL HIGH (ref 70–99)
Glucose, Bld: 106 mg/dL — ABNORMAL HIGH (ref 70–99)
Potassium: 3.7 mmol/L (ref 3.5–5.1)
Potassium: 3.5 mmol/L (ref 3.5–5.1)
Sodium: 137 mmol/L (ref 135–145)
Sodium: 142 mmol/L (ref 135–145)
Total Bilirubin: 0.2 mg/dL — ABNORMAL LOW (ref 0.3–1.2)
Total Bilirubin: 0.7 mg/dL (ref 0.3–1.2)
Total Protein: 6.3 g/dL — ABNORMAL LOW (ref 6.5–8.1)
Total Protein: 7.4 g/dL (ref 6.5–8.1)

## 2020-12-10 LAB — ECHOCARDIOGRAM COMPLETE
Area-P 1/2: 4.8 cm2
Calc EF: 60.5 %
Height: 66 in
S' Lateral: 3.3 cm
Single Plane A2C EF: 55.5 %
Single Plane A4C EF: 64.7 %
Weight: 2458.57 oz

## 2020-12-10 LAB — I-STAT ARTERIAL BLOOD GAS, ED
Acid-base deficit: 7 mmol/L — ABNORMAL HIGH (ref 0.0–2.0)
Bicarbonate: 18.4 mmol/L — ABNORMAL LOW (ref 20.0–28.0)
Calcium, Ion: 1.18 mmol/L (ref 1.15–1.40)
HCT: 48 % (ref 39.0–52.0)
Hemoglobin: 16.3 g/dL (ref 13.0–17.0)
O2 Saturation: 100 %
Patient temperature: 99.1
Potassium: 3.5 mmol/L (ref 3.5–5.1)
Sodium: 139 mmol/L (ref 135–145)
TCO2: 19 mmol/L — ABNORMAL LOW (ref 22–32)
pCO2 arterial: 37.3 mmHg (ref 32.0–48.0)
pH, Arterial: 7.303 — ABNORMAL LOW (ref 7.350–7.450)
pO2, Arterial: 196 mmHg — ABNORMAL HIGH (ref 83.0–108.0)

## 2020-12-10 LAB — URINALYSIS, ROUTINE W REFLEX MICROSCOPIC
Bilirubin Urine: NEGATIVE
Glucose, UA: NEGATIVE mg/dL
Ketones, ur: NEGATIVE mg/dL
Leukocytes,Ua: NEGATIVE
Nitrite: NEGATIVE
Protein, ur: NEGATIVE mg/dL
Specific Gravity, Urine: 1.002 — ABNORMAL LOW (ref 1.005–1.030)
pH: 5 (ref 5.0–8.0)

## 2020-12-10 LAB — TROPONIN I (HIGH SENSITIVITY)
Troponin I (High Sensitivity): 36 ng/L — ABNORMAL HIGH (ref <18)
Troponin I (High Sensitivity): 219 ng/L (ref <18)

## 2020-12-10 LAB — CBC
HCT: 44.9 % (ref 39.0–52.0)
Hemoglobin: 15.8 g/dL (ref 13.0–17.0)
MCH: 33.1 pg (ref 26.0–34.0)
MCHC: 35.2 g/dL (ref 30.0–36.0)
MCV: 93.9 fL (ref 80.0–100.0)
Platelets: 241 10*3/uL (ref 150–400)
RBC: 4.78 MIL/uL (ref 4.22–5.81)
RDW: 11.2 % — ABNORMAL LOW (ref 11.5–15.5)
WBC: 10.9 10*3/uL — ABNORMAL HIGH (ref 4.0–10.5)
nRBC: 0 % (ref 0.0–0.2)

## 2020-12-10 LAB — HIV ANTIBODY (ROUTINE TESTING W REFLEX): HIV Screen 4th Generation wRfx: NONREACTIVE

## 2020-12-10 LAB — CBC WITH DIFFERENTIAL/PLATELET
Abs Immature Granulocytes: 0.67 10*3/uL — ABNORMAL HIGH (ref 0.00–0.07)
Basophils Absolute: 0 10*3/uL (ref 0.0–0.1)
Basophils Relative: 0 %
Eosinophils Absolute: 0.1 10*3/uL (ref 0.0–0.5)
Eosinophils Relative: 1 %
HCT: 43.8 % (ref 39.0–52.0)
Hemoglobin: 13.9 g/dL (ref 13.0–17.0)
Immature Granulocytes: 9 %
Lymphocytes Relative: 44 %
Lymphs Abs: 3.2 10*3/uL (ref 0.7–4.0)
MCH: 32.6 pg (ref 26.0–34.0)
MCHC: 31.7 g/dL (ref 30.0–36.0)
MCV: 102.8 fL — ABNORMAL HIGH (ref 80.0–100.0)
Monocytes Absolute: 0.3 10*3/uL (ref 0.1–1.0)
Monocytes Relative: 4 %
Neutro Abs: 3.1 10*3/uL (ref 1.7–7.7)
Neutrophils Relative %: 42 %
Platelets: 226 10*3/uL (ref 150–400)
RBC: 4.26 MIL/uL (ref 4.22–5.81)
RDW: 11.5 % (ref 11.5–15.5)
WBC: 7.4 10*3/uL (ref 4.0–10.5)
nRBC: 0.3 % — ABNORMAL HIGH (ref 0.0–0.2)

## 2020-12-10 LAB — RAPID URINE DRUG SCREEN, HOSP PERFORMED
Amphetamines: NOT DETECTED
Barbiturates: NOT DETECTED
Benzodiazepines: NOT DETECTED
Cocaine: POSITIVE — AB
Opiates: NOT DETECTED
Tetrahydrocannabinol: NOT DETECTED

## 2020-12-10 LAB — LACTIC ACID, PLASMA
Lactic Acid, Venous: 8.1 mmol/L (ref 0.5–1.9)
Lactic Acid, Venous: 5 mmol/L (ref 0.5–1.9)

## 2020-12-10 LAB — ETHANOL: Alcohol, Ethyl (B): 128 mg/dL — ABNORMAL HIGH (ref <10)

## 2020-12-10 LAB — RESP PANEL BY RT-PCR (FLU A&B, COVID) ARPGX2
Influenza A by PCR: NEGATIVE
Influenza B by PCR: NEGATIVE
SARS Coronavirus 2 by RT PCR: NEGATIVE

## 2020-12-10 LAB — GLUCOSE, CAPILLARY
Glucose-Capillary: 108 mg/dL — ABNORMAL HIGH (ref 70–99)
Glucose-Capillary: 117 mg/dL — ABNORMAL HIGH (ref 70–99)
Glucose-Capillary: 117 mg/dL — ABNORMAL HIGH (ref 70–99)
Glucose-Capillary: 108 mg/dL — ABNORMAL HIGH (ref 70–99)
Glucose-Capillary: 145 mg/dL — ABNORMAL HIGH (ref 70–99)

## 2020-12-10 LAB — PHOSPHORUS: Phosphorus: 2.6 mg/dL (ref 2.5–4.6)

## 2020-12-10 LAB — ACETAMINOPHEN LEVEL: Acetaminophen (Tylenol), Serum: <10 ug/mL — ABNORMAL LOW (ref 10–30)

## 2020-12-10 LAB — CBG MONITORING, ED: Glucose-Capillary: 185 mg/dL — ABNORMAL HIGH (ref 70–99)

## 2020-12-10 LAB — MAGNESIUM
Magnesium: 2.1 mg/dL (ref 1.7–2.4)
Magnesium: 1.7 mg/dL (ref 1.7–2.4)
Magnesium: 2.4 mg/dL (ref 1.7–2.4)

## 2020-12-10 LAB — MRSA NEXT GEN BY PCR, NASAL: MRSA by PCR Next Gen: NOT DETECTED

## 2020-12-10 LAB — SALICYLATE LEVEL: Salicylate Lvl: <7 mg/dL — ABNORMAL LOW (ref 7.0–30.0)

## 2020-12-10 MED ORDER — FENTANYL CITRATE (PF) 100 MCG/2ML IJ SOLN
50.0000 ug | INTRAMUSCULAR | Status: DC | PRN
  Administered 2020-12-10: 100 ug via INTRAVENOUS
  Administered 2020-12-10: 200 ug via INTRAVENOUS
  Administered 2020-12-10 – 2020-12-11 (×2): 100 ug via INTRAVENOUS
  Filled 2020-12-10 (×3): qty 2
  Filled 2020-12-10: qty 4

## 2020-12-10 MED ORDER — INSULIN ASPART 100 UNIT/ML IJ SOLN
0.0000 [IU] | INTRAMUSCULAR | Status: DC
  Administered 2020-12-10: 2 [IU] via SUBCUTANEOUS
  Administered 2020-12-10: 3 [IU] via SUBCUTANEOUS
  Administered 2020-12-11 – 2020-12-14 (×10): 2 [IU] via SUBCUTANEOUS

## 2020-12-10 MED ORDER — PROPOFOL 1000 MG/100ML IV EMUL
0.0000 ug/kg/min | INTRAVENOUS | Status: DC
  Administered 2020-12-10 (×5): 50 ug/kg/min via INTRAVENOUS
  Administered 2020-12-10: 5 ug/kg/min via INTRAVENOUS
  Administered 2020-12-11 (×6): 50 ug/kg/min via INTRAVENOUS
  Administered 2020-12-12: 30 ug/kg/min via INTRAVENOUS
  Administered 2020-12-12: 40 ug/kg/min via INTRAVENOUS
  Administered 2020-12-12: 30 ug/kg/min via INTRAVENOUS
  Administered 2020-12-12 (×2): 40 ug/kg/min via INTRAVENOUS
  Administered 2020-12-13: 50 ug/kg/min via INTRAVENOUS
  Administered 2020-12-13: 40 ug/kg/min via INTRAVENOUS
  Administered 2020-12-13 – 2020-12-15 (×11): 50 ug/kg/min via INTRAVENOUS
  Filled 2020-12-10 (×14): qty 100
  Filled 2020-12-10: qty 200
  Filled 2020-12-10 (×9): qty 100

## 2020-12-10 MED ORDER — DOCUSATE SODIUM 50 MG/5ML PO LIQD
100.0000 mg | Freq: Two times a day (BID) | ORAL | Status: DC
  Administered 2020-12-10 – 2020-12-13 (×8): 100 mg
  Filled 2020-12-10 (×9): qty 10

## 2020-12-10 MED ORDER — SODIUM CHLORIDE 0.9 % IV BOLUS
1000.0000 mL | Freq: Once | INTRAVENOUS | Status: AC
  Administered 2020-12-10: 1000 mL via INTRAVENOUS

## 2020-12-10 MED ORDER — MAGNESIUM SULFATE 2 GM/50ML IV SOLN
2.0000 g | Freq: Once | INTRAVENOUS | Status: AC
  Administered 2020-12-10: 2 g via INTRAVENOUS
  Filled 2020-12-10: qty 50

## 2020-12-10 MED ORDER — CHLORHEXIDINE GLUCONATE 0.12% ORAL RINSE (MEDLINE KIT)
15.0000 mL | Freq: Two times a day (BID) | OROMUCOSAL | Status: DC
  Administered 2020-12-10 – 2020-12-15 (×11): 15 mL via OROMUCOSAL

## 2020-12-10 MED ORDER — POLYETHYLENE GLYCOL 3350 17 G PO PACK
17.0000 g | PACK | Freq: Every day | ORAL | Status: DC
  Administered 2020-12-10 – 2020-12-12 (×3): 17 g
  Filled 2020-12-10 (×3): qty 1

## 2020-12-10 MED ORDER — POTASSIUM CHLORIDE 20 MEQ PO PACK
40.0000 meq | PACK | Freq: Once | ORAL | Status: AC
  Administered 2020-12-10: 40 meq
  Filled 2020-12-10: qty 2

## 2020-12-10 MED ORDER — VITAL AF 1.2 CAL PO LIQD
1000.0000 mL | ORAL | Status: DC
  Administered 2020-12-10 – 2020-12-13 (×5): 1000 mL

## 2020-12-10 MED ORDER — IPRATROPIUM-ALBUTEROL 0.5-2.5 (3) MG/3ML IN SOLN: 3.0000 mL | RESPIRATORY_TRACT | Status: DC | PRN

## 2020-12-10 MED ORDER — LACTATED RINGERS IV SOLN: INTRAVENOUS | Status: DC

## 2020-12-10 MED ORDER — HEPARIN SODIUM (PORCINE) 5000 UNIT/ML IJ SOLN
5000.0000 [IU] | Freq: Three times a day (TID) | INTRAMUSCULAR | Status: DC
  Administered 2020-12-10 – 2020-12-15 (×16): 5000 [IU] via SUBCUTANEOUS
  Filled 2020-12-10 (×16): qty 1

## 2020-12-10 MED ORDER — POLYETHYLENE GLYCOL 3350 17 G PO PACK: 17.0000 g | PACK | Freq: Every day | ORAL | Status: DC | PRN

## 2020-12-10 MED ORDER — ORAL CARE MOUTH RINSE
15.0000 mL | OROMUCOSAL | Status: DC
  Administered 2020-12-10 – 2020-12-15 (×54): 15 mL via OROMUCOSAL

## 2020-12-10 MED ORDER — DOCUSATE SODIUM 50 MG/5ML PO LIQD: 100.0000 mg | Freq: Two times a day (BID) | ORAL | Status: DC | PRN

## 2020-12-10 MED ORDER — PANTOPRAZOLE SODIUM 40 MG IV SOLR
40.0000 mg | Freq: Every day | INTRAVENOUS | Status: DC
  Administered 2020-12-10 – 2020-12-11 (×2): 40 mg via INTRAVENOUS
  Filled 2020-12-10 (×2): qty 40

## 2020-12-10 MED ORDER — CHLORHEXIDINE GLUCONATE CLOTH 2 % EX PADS
6.0000 | MEDICATED_PAD | Freq: Every day | CUTANEOUS | Status: DC
  Administered 2020-12-10 – 2020-12-15 (×6): 6 via TOPICAL

## 2020-12-10 MED ORDER — SODIUM CHLORIDE 0.9 % IV SOLN
3.0000 g | Freq: Four times a day (QID) | INTRAVENOUS | Status: AC
  Administered 2020-12-10 – 2020-12-15 (×20): 3 g via INTRAVENOUS
  Filled 2020-12-10 (×17): qty 8
  Filled 2020-12-10: qty 3
  Filled 2020-12-10 (×4): qty 8

## 2020-12-10 MED ORDER — FENTANYL CITRATE (PF) 100 MCG/2ML IJ SOLN
50.0000 ug | INTRAMUSCULAR | Status: DC | PRN
Start: 2020-12-10 — End: 2020-12-15
  Administered 2020-12-10: 50 ug via INTRAVENOUS
  Filled 2020-12-10: qty 2
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
## 2020-12-10 NOTE — Progress Notes (Signed)
Cooling blanket applied.
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
## 2020-12-10 NOTE — Plan of Care (Signed)
  Problem: Clinical Measurements: Goal: Ability to maintain clinical measurements within normal limits will improve Outcome: Not Progressing Goal: Will remain free from infection Outcome: Not Progressing Goal: Diagnostic test results will improve Outcome: Not Progressing Goal: Respiratory complications will improve Outcome: Not Progressing Goal: Cardiovascular complication will be avoided Outcome: Not Progressing
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
## 2020-12-10 NOTE — Progress Notes (Signed)
  Echocardiogram 2D Echocardiogram has been performed.  Ethan Martinez 12/04/2020, 2:35 PM
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
## 2020-12-10 NOTE — Progress Notes (Signed)
RT NOTE:  ETT pulled back and secured @ 24 cm center lip per MD verbal order.

## 2020-12-10 NOTE — Progress Notes (Signed)
At [place] this RN called into the room by EEG tech to stop propofol complete as a verbal order from neurology. This was confirmed with CCM NP. At 0932 propofol stopped. At [phone] this RN entered the room and EEG tech informs that neurology has said the propofol can be restarted. Propofol restarted at that time and notified CCM NP. Will continue to monitor and patient will be placed on LTM EEG.

## 2020-12-10 NOTE — Progress Notes (Signed)
[facility]  EEG REPORT    NAME: Ethan Martinez   MRN: [id number]  DOB: 01/24/1964  GENDER: male AGE: 57yr     SERVICE DATE: 11/20/21  STUDY DURATION: 33 minutes  STUDY NUMBER: 2023  ORDERING PHYSICIAN: Rasmussen, Ben Gregory, DO  EEG TECHNOLOGIST: R. Bastidas  R. EEG T.  LOCATION: [facility]  EEG SYSTEM: [employer]     CLINICAL HISTORY:  60yr old male who is having this EEG done for abnormal spell.    Other Data   MEDICATIONS:  Current Outpatient Medications   Medication Sig Dispense Refill    Albuterol (PROAIR HFA, PROVENTIL HFA, VENTOLIN HFA) 90 mcg/actuation inhaler Take 1-2 puffs by inhalation every 4 hours if needed for wheezing. 17 g 1    Allopurinol (ZYLOPRIM) 300 mg Tablet TAKE ONE TABLET BY MOUTH EVERY DAY 90 tablet 1    Aspirin 81 mg Chewable Tablet Take 1 tablet by mouth every day. 30 tablet 0    Atorvastatin (LIPITOR) 40 mg tablet TAKE ONE TABLET BY MOUTH AT BEDTIME 90 tablet 1    Azelaic Acid (FINACEA) 15 % Gel Apply to forehead, cheeks, nose, and chin twice daily 50 g 3    Azelastine Nasal (ASTELIN) 137 mcg (0.1 %) Spray Instill 2 sprays into EACH nostril 2 times daily. 30 mL 6    Chlorthalidone (THALITONE) 25 mg Tablet TAKE ONE TABLET BY MOUTH EVERY DAY 30 tablet 5    Diclofenac (VOLTAREN) 1 % Gel Apply 2 g to the affected area three times daily if needed. 100 g 1    FLOVENT HFA 110 mcg/actuation Inhaler Take 1 puff by inhalation every day. 12 g 1    Fluticasone (FLONASE) 50 mcg/actuation nasal spray Instill 1 spray into EACH nostril every day. Use after performing nasal saline irrigations 16 g 11    Gabapentin (NEURONTIN) 300 mg Capsule TAKE ONE CAPSULE BY MOUTH EVERY MORNING AND TAKE TWO CAPSULES BY MOUTH EVERY EVENING 90 capsule 5    Losartan (COZAAR) 100 mg tablet TAKE ONE TABLET BY MOUTH EVERY DAY 30 tablet 5    Pantoprazole (PROTONIX) 40 mg Delayed Release Tablet TAKE ONE TABLET BY MOUTH EVERY MORNING BEFORE A MEAL 90 tablet 0    Prazosin (MINIPRESS) 5 mg Capsule Take 1 capsule by mouth  every day at bedtime. 90 capsule 1    Trazodone (DESYREL) 50 mg Tablet TAKE ONE TABLET BY MOUTH AT BEDTIME AS NEEDED 30 tablet 5     No current facility-administered medications for this visit.       TECHNICAL DESCRIPTION:  This digital video EEG was performed using the standard 10-20 system of electrode placement and one channel of EKG.           EEG DESCRIPTION:    Clinical State: Awake  Background:  9-10 Hz; posterior head regions; symmetric, waxing and waning, reactive to eye opening and closure  Beta: 18-22 Hz; frontocentral, symmetric, waxing and waning        No stage II/N2 sleep was recorded    ACTIVATION:  Hyperventilation was performed for 3-minutes, producing no abnormal discharges  Photic stimulation was performed at frequencies ranging from 1-60 Hz, producing no abnormal discharges           IMPRESSION: Normal        CLINICAL CORRELATION:  This EEG in the awake state is normal. There are no focal, lateralized or epileptiform abnormalities.   No episodes of concern were captured.      Palak   Parikh, MD
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
## 2020-12-10 NOTE — Procedures (Signed)
Patient Name: Ethan DETWEILER Sr.  MRN: [id number]  Epilepsy Attending: Lora Havens  Referring Physician/Provider: Andres Labrum, PA Date: 12/13/2020  Duration: 49.36 mins  Patient history: 60 year old male status post cardiac arrest.  EEG to evaluate for seizures.  Level of alertness: comatose  AEDs during EEG study: Propofol  Technical aspects: This EEG study was done with scalp electrodes positioned according to the 10-20 International system of electrode placement. Electrical activity was acquired at a sampling rate of 500Hz  and reviewed with a high frequency filter of 70Hz  and a low frequency filter of 1Hz . EEG data were recorded continuously and digitally stored.   Description: Patient was noted to have intermittent generalized nonrhythmic whole-body twitching. Concomitant EEG showed generalized sharply contoured 3 to 5 Hz theta-delta slowing.  After propofol was stopped, episodes of whole body shaking worsened.  These episodes could be myoclonic seizures but the semiology looks atypical.  In between episodes of jerking, EEG showed generalized background attenuation.  EEG was reactive to noxious stimulation.  Hyperventilation and photic stimulation were not performed.     IMPRESSION: Patient was noted to have intermittent generalized nonrhythmic whole-body twitching which worsened after propofol was stopped.  In a patient with history of cardiac arrest, these episodes are concerning for myoclonic seizures but the semiology and the EEG pattern is atypical.  EEG is also suggestive of severe to profound diffuse encephalopathy which could be secondary to sedation, anoxic/hypoxic brain injury.  Recommend prolonged EEG monitoring for further characterization of spells.  Kjersti Dittmer Barbra Sarks

## 2020-12-10 NOTE — Progress Notes (Signed)
Started cEEG study.  Notified [facility] monitoring.  Patient event button tested.

## 2020-12-11 ENCOUNTER — Inpatient Hospital Stay (HOSPITAL_COMMUNITY): Payer: Medicare HMO

## 2020-12-11 DIAGNOSIS — I469 Cardiac arrest, cause unspecified: Secondary | ICD-10-CM | POA: Diagnosis not present

## 2020-12-11 LAB — CBC
HCT: 44.5 % (ref 39.0–52.0)
Hemoglobin: 15.6 g/dL (ref 13.0–17.0)
MCH: 33.3 pg (ref 26.0–34.0)
MCHC: 35.1 g/dL (ref 30.0–36.0)
MCV: 94.9 fL (ref 80.0–100.0)
Platelets: 189 10*3/uL (ref 150–400)
RBC: 4.69 MIL/uL (ref 4.22–5.81)
RDW: 11.4 % — ABNORMAL LOW (ref 11.5–15.5)
WBC: 13.8 10*3/uL — ABNORMAL HIGH (ref 4.0–10.5)
nRBC: 0 % (ref 0.0–0.2)

## 2020-12-11 LAB — URINE CULTURE: Culture: NO GROWTH

## 2020-12-11 LAB — HEPATIC FUNCTION PANEL
ALT: 304 U/L — ABNORMAL HIGH (ref 0–44)
AST: 483 U/L — ABNORMAL HIGH (ref 15–41)
Albumin: 3.5 g/dL (ref 3.5–5.0)
Alkaline Phosphatase: 72 U/L (ref 38–126)
Bilirubin, Direct: 0.2 mg/dL (ref 0.0–0.2)
Indirect Bilirubin: 0.6 mg/dL (ref 0.3–0.9)
Total Bilirubin: 0.8 mg/dL (ref 0.3–1.2)
Total Protein: 6.5 g/dL (ref 6.5–8.1)

## 2020-12-11 LAB — GLUCOSE, CAPILLARY
Glucose-Capillary: 122 mg/dL — ABNORMAL HIGH (ref 70–99)
Glucose-Capillary: 120 mg/dL — ABNORMAL HIGH (ref 70–99)
Glucose-Capillary: 118 mg/dL — ABNORMAL HIGH (ref 70–99)
Glucose-Capillary: 129 mg/dL — ABNORMAL HIGH (ref 70–99)
Glucose-Capillary: 143 mg/dL — ABNORMAL HIGH (ref 70–99)
Glucose-Capillary: 118 mg/dL — ABNORMAL HIGH (ref 70–99)

## 2020-12-11 LAB — BASIC METABOLIC PANEL
Anion gap: 8 (ref 5–15)
BUN: 12 mg/dL (ref 6–20)
CO2: 23 mmol/L (ref 22–32)
Calcium: 8.8 mg/dL — ABNORMAL LOW (ref 8.9–10.3)
Chloride: 105 mmol/L (ref 98–111)
Creatinine, Ser: 1.13 mg/dL (ref 0.61–1.24)
GFR, Estimated: >60 mL/min (ref >60)
Glucose, Bld: 131 mg/dL — ABNORMAL HIGH (ref 70–99)
Potassium: 3.6 mmol/L (ref 3.5–5.1)
Sodium: 136 mmol/L (ref 135–145)

## 2020-12-11 LAB — TRIGLYCERIDES: Triglycerides: 189 mg/dL — ABNORMAL HIGH (ref <150)

## 2020-12-11 LAB — MAGNESIUM
Magnesium: 2.4 mg/dL (ref 1.7–2.4)
Magnesium: 2.2 mg/dL (ref 1.7–2.4)

## 2020-12-11 LAB — LACTIC ACID, PLASMA: Lactic Acid, Venous: 1.5 mmol/L (ref 0.5–1.9)

## 2020-12-11 LAB — HEMOGLOBIN A1C
Hgb A1c MFr Bld: 5.3 % (ref 4.8–5.6)
Mean Plasma Glucose: 105 mg/dL

## 2020-12-11 LAB — PHOSPHORUS
Phosphorus: 2 mg/dL — ABNORMAL LOW (ref 2.5–4.6)
Phosphorus: 2.5 mg/dL (ref 2.5–4.6)

## 2020-12-11 LAB — TROPONIN I (HIGH SENSITIVITY): Troponin I (High Sensitivity): 465 ng/L (ref <18)

## 2020-12-11 MED ORDER — K PHOS MONO-SOD PHOS DI & MONO 155-852-130 MG PO TABS
500.0000 mg | ORAL_TABLET | Freq: Once | ORAL | Status: AC
  Administered 2020-12-11: 500 mg
  Filled 2020-12-11: qty 2

## 2020-12-11 MED ORDER — IBUPROFEN 100 MG/5ML PO SUSP
200.0000 mg | Freq: Once | ORAL | Status: AC
  Administered 2020-12-11: 200 mg
  Filled 2020-12-11: qty 10

## 2020-12-11 MED ORDER — HYDRALAZINE HCL 20 MG/ML IJ SOLN
10.0000 mg | Freq: Four times a day (QID) | INTRAMUSCULAR | Status: DC | PRN
  Administered 2020-12-11: 10 mg via INTRAVENOUS
  Filled 2020-12-11 (×2): qty 1

## 2020-12-11 NOTE — Progress Notes (Signed)
LTM maint complete - no skin breakdown under:  fp1, fp2, f3, f4,  f7, fz,f8
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
## 2020-12-11 NOTE — Progress Notes (Signed)
eLink Physician-Brief Progress Note Patient Name: Ethan HARKLEROAD Sr. DOB: [birth year] MRN: [id number]   Date of Service  12/11/2020  HPI/Events of Note  Patient with Temp to 101.5 F. Creatinine = 1.11. Can't have Tylenol d/t elevated LFT's. Currently on cooling blanket and ice packs.   eICU Interventions  Plan: Motrin suspension 200 mg per tube X 1 now.      Intervention Category Major Interventions: Other:  Brit Wernette Cornelia Copa 12/11/2020, 1:17 AM

## 2020-12-11 NOTE — Procedures (Signed)
Patient Name: Ethan GERSTEN Sr.  MRN: [id number]  Epilepsy Attending: Lora Havens  Referring Physician/Provider: Dr Zeb Comfort Duration: 12/09/2020 1005 to 12/11/2020 1005   Patient history: 60 year old male status post cardiac arrest.  EEG to evaluate for seizures.   Level of alertness: comatose   AEDs during EEG study: Propofol   Technical aspects: This EEG study was done with scalp electrodes positioned according to the 10-20 International system of electrode placement. Electrical activity was acquired at a sampling rate of 500Hz  and reviewed with a high frequency filter of 70Hz  and a low frequency filter of 1Hz . EEG data were recorded continuously and digitally stored.   Description: EEG showed near continuous generalized background attenuation.  EEG was reactive to noxious stimulation.  Hyperventilation and photic stimulation were not performed.     ABNORMALITY - Background attenuation, generalized   IMPRESSION: This study is suggestive of profound diffuse encephalopathy which could be secondary to sedation, anoxic/hypoxic brain injury. No seizures were seen during this study.   Ethan Martinez

## 2020-12-11 NOTE — Progress Notes (Signed)
Attending:    Subjective: Fever overnight Cooling blankets in place Given ibuprofen suspension Lactic acidosis improved Cultures pending LFT's better Cr OK  Objective: Vitals:   12/11/20 0830 12/11/20 0900 12/11/20 0930 12/11/20 1000  BP: (!) 171/100 (!) 153/96 (!) 145/74 (!) 162/90  Pulse: (!) 109 (!) 120 (!) 126 (!) 123  Resp: (!) 22 (!) 24 (!) 27 (!) 27  Temp: (!) 97.34 F (36.3 C) (!) 97.16 F (36.2 C) (!) 97.34 F (36.3 C) 97.7 F (36.5 C)  TempSrc:      SpO2: 100% 100% 100% 100%  Weight:      Height:       Vent Mode: CPAP;PSV FiO2 (%):  [30 %-40 %] 30 % Set Rate:  [16 bmp] 16 bmp Vt Set:  [500 mL] 500 mL PEEP:  [5 cmH20] 5 cmH20 Pressure Support:  [10 cmH20] 10 cmH20 Plateau Pressure:  [10 cmH20-15 cmH20] 10 cmH20  Intake/Output Summary (Last 24 hours) at 12/11/2020 1038 Last data filed at 12/11/2020 1000 Gross per 24 hour  Intake 3991.9 ml  Output 1740 ml  Net 2251.9 ml    General:  In bed on vent HENT: NCAT ETT in place PULM: CTA B, vent supported breathing CV: RRR, no mgr GI: BS+, soft, nontender MSK: normal bulk and tone Neuro: cough with suctioning present, however there is no cornea reflex, no pupillary light response, no doll's eyes response, no motor movement to touch, voice or pain.  There is fine, rhythmic motion of feet bilatearlly  CBC    Component Value Date/Time   WBC 13.8 (H) 12/11/2020 0444   RBC 4.69 12/11/2020 0444   HGB 15.6 12/11/2020 0444   HCT 44.5 12/11/2020 0444   PLT 189 12/11/2020 0444   MCV 94.9 12/11/2020 0444   MCH 33.3 12/11/2020 0444   MCHC 35.1 12/11/2020 0444   RDW 11.4 (L) 12/11/2020 0444   LYMPHSABS 3.2 11/25/2020 0027   MONOABS 0.3 11/25/2020 0027   EOSABS 0.1 11/26/2020 0027   BASOSABS 0.0 11/20/2020 0027    BMET    Component Value Date/Time   NA 136 12/11/2020 0444   K 3.6 12/11/2020 0444   CL 105 12/11/2020 0444   CO2 23 12/11/2020 0444   GLUCOSE 131 (H) 12/11/2020 0444   BUN 12 12/11/2020 0444    CREATININE 1.13 12/11/2020 0444   CALCIUM 8.8 (L) 12/11/2020 0444   GFRNONAA >60 12/11/2020 0444   GFRAA >60 01/30/2017 1637    CXR images none  Echo: mild LVH, RV OK, LVEF OK, valves OK  Impression/Plan: Acute anoxic brain injury, myoclonic jerking: myoclonus? Monitoring for 72 hours after arrest prior to making prognostication assessment but at this point his prognosis seems poor, treat fever as able Shock: resolved, monitor off vasopressors Stage IIIa CKD: monitor daily BMET, UOP Hyperglycemia: SSI, q4H Acute respiratory failure with hypoxemia: continue full vent support and vap prevention. Aspiration pneumonia> unasyn 5 days, f/u resp culture  Family: two children live [place], mother lives locally, they have not had prior conversation about this   My cc time 30 minutes  Roselie Awkward, MD [place] [facility] Pager: [phone] Cell: [phone] After 3pm or if no response, call [phone]

## 2020-12-12 DIAGNOSIS — R7401 Elevation of levels of liver transaminase levels: Secondary | ICD-10-CM

## 2020-12-12 LAB — CBC
HCT: 41.7 % (ref 39.0–52.0)
Hemoglobin: 13.6 g/dL (ref 13.0–17.0)
MCH: 32 pg (ref 26.0–34.0)
MCHC: 32.6 g/dL (ref 30.0–36.0)
MCV: 98.1 fL (ref 80.0–100.0)
Platelets: 183 10*3/uL (ref 150–400)
RBC: 4.25 MIL/uL (ref 4.22–5.81)
RDW: 11.9 % (ref 11.5–15.5)
WBC: 6.6 10*3/uL (ref 4.0–10.5)
nRBC: 0 % (ref 0.0–0.2)

## 2020-12-12 LAB — GLUCOSE, CAPILLARY
Glucose-Capillary: 120 mg/dL — ABNORMAL HIGH (ref 70–99)
Glucose-Capillary: 109 mg/dL — ABNORMAL HIGH (ref 70–99)
Glucose-Capillary: 107 mg/dL — ABNORMAL HIGH (ref 70–99)
Glucose-Capillary: 142 mg/dL — ABNORMAL HIGH (ref 70–99)
Glucose-Capillary: 115 mg/dL — ABNORMAL HIGH (ref 70–99)
Glucose-Capillary: 119 mg/dL — ABNORMAL HIGH (ref 70–99)

## 2020-12-12 LAB — HEPATIC FUNCTION PANEL
ALT: 159 U/L — ABNORMAL HIGH (ref 0–44)
AST: 187 U/L — ABNORMAL HIGH (ref 15–41)
Albumin: 2.8 g/dL — ABNORMAL LOW (ref 3.5–5.0)
Alkaline Phosphatase: 60 U/L (ref 38–126)
Bilirubin, Direct: 0.1 mg/dL (ref 0.0–0.2)
Indirect Bilirubin: 0.4 mg/dL (ref 0.3–0.9)
Total Bilirubin: 0.5 mg/dL (ref 0.3–1.2)
Total Protein: 5.6 g/dL — ABNORMAL LOW (ref 6.5–8.1)

## 2020-12-12 LAB — BASIC METABOLIC PANEL
Anion gap: 6 (ref 5–15)
BUN: 11 mg/dL (ref 6–20)
CO2: 28 mmol/L (ref 22–32)
Calcium: 8.4 mg/dL — ABNORMAL LOW (ref 8.9–10.3)
Chloride: 106 mmol/L (ref 98–111)
Creatinine, Ser: 0.99 mg/dL (ref 0.61–1.24)
GFR, Estimated: >60 mL/min (ref >60)
Glucose, Bld: 115 mg/dL — ABNORMAL HIGH (ref 70–99)
Potassium: 3.4 mmol/L — ABNORMAL LOW (ref 3.5–5.1)
Sodium: 140 mmol/L (ref 135–145)

## 2020-12-12 LAB — MAGNESIUM: Magnesium: 2 mg/dL (ref 1.7–2.4)

## 2020-12-12 LAB — PHOSPHORUS: Phosphorus: 2.3 mg/dL — ABNORMAL LOW (ref 2.5–4.6)

## 2020-12-12 LAB — TROPONIN I (HIGH SENSITIVITY): Troponin I (High Sensitivity): 144 ng/L (ref <18)

## 2020-12-12 MED ORDER — POTASSIUM CHLORIDE 20 MEQ PO PACK
40.0000 meq | PACK | Freq: Once | ORAL | Status: AC
  Administered 2020-12-12: 40 meq
  Filled 2020-12-12: qty 2

## 2020-12-12 MED ORDER — PANTOPRAZOLE SODIUM 40 MG PO PACK
40.0000 mg | PACK | Freq: Every day | ORAL | Status: DC
  Administered 2020-12-12 – 2020-12-13 (×2): 40 mg
  Filled 2020-12-12 (×4): qty 20
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
## 2020-12-12 NOTE — Procedures (Addendum)
Patient Name: JOVAUN LEVENE Sr.  MRN: [id number]  Epilepsy Attending: Lora Havens  Referring Physician/Provider: Dr Zeb Comfort Duration: 12/11/2020 1005 to 12/12/2020 1028   Patient history: 60 year old male status post cardiac arrest.  EEG to evaluate for seizures.   Level of alertness: comatose   AEDs during EEG study: Propofol   Technical aspects: This EEG study was done with scalp electrodes positioned according to the 10-20 International system of electrode placement. Electrical activity was acquired at a sampling rate of 500Hz  and reviewed with a high frequency filter of 70Hz  and a low frequency filter of 1Hz . EEG data were recorded continuously and digitally stored.   Description: EEG showed near continuous generalized background attenuation.  EEG was not reactive to noxious stimulation.  Hyperventilation and photic stimulation were not performed.      ABNORMALITY - Background attenuation, generalized   IMPRESSION: This study is suggestive of profound diffuse encephalopathy which could be secondary to sedation, anoxic/hypoxic brain injury. No seizures and epileptiform discharges were seen during this study.   Riddick Nuon Barbra Sarks

## 2020-12-12 NOTE — Progress Notes (Signed)
vLTM EEG complete. No skin breakdown 

## 2020-12-13 ENCOUNTER — Inpatient Hospital Stay (HOSPITAL_COMMUNITY): Payer: Medicare HMO

## 2020-12-13 LAB — CBC
HCT: 44.5 % (ref 39.0–52.0)
Hemoglobin: 15.1 g/dL (ref 13.0–17.0)
MCH: 32.4 pg (ref 26.0–34.0)
MCHC: 33.9 g/dL (ref 30.0–36.0)
MCV: 95.5 fL (ref 80.0–100.0)
Platelets: 196 10*3/uL (ref 150–400)
RBC: 4.66 MIL/uL (ref 4.22–5.81)
RDW: 11.9 % (ref 11.5–15.5)
WBC: 6 10*3/uL (ref 4.0–10.5)
nRBC: 0 % (ref 0.0–0.2)

## 2020-12-13 LAB — GLUCOSE, CAPILLARY
Glucose-Capillary: 112 mg/dL — ABNORMAL HIGH (ref 70–99)
Glucose-Capillary: 114 mg/dL — ABNORMAL HIGH (ref 70–99)
Glucose-Capillary: 130 mg/dL — ABNORMAL HIGH (ref 70–99)
Glucose-Capillary: 133 mg/dL — ABNORMAL HIGH (ref 70–99)
Glucose-Capillary: 134 mg/dL — ABNORMAL HIGH (ref 70–99)
Glucose-Capillary: 135 mg/dL — ABNORMAL HIGH (ref 70–99)

## 2020-12-13 LAB — POCT I-STAT 7, (LYTES, BLD GAS, ICA,H+H)
Acid-Base Excess: 2 mmol/L (ref 0.0–2.0)
Bicarbonate: 25 mmol/L (ref 20.0–28.0)
Calcium, Ion: 1.24 mmol/L (ref 1.15–1.40)
HCT: 43 % (ref 39.0–52.0)
Hemoglobin: 14.6 g/dL (ref 13.0–17.0)
O2 Saturation: 97 %
Patient temperature: 37.3
Potassium: 3.5 mmol/L (ref 3.5–5.1)
Sodium: 141 mmol/L (ref 135–145)
TCO2: 26 mmol/L (ref 22–32)
pCO2 arterial: 35.4 mmHg (ref 32.0–48.0)
pH, Arterial: 7.459 — ABNORMAL HIGH (ref 7.350–7.450)
pO2, Arterial: 83 mmHg (ref 83.0–108.0)

## 2020-12-13 LAB — BASIC METABOLIC PANEL
Anion gap: 9 (ref 5–15)
BUN: 15 mg/dL (ref 6–20)
CO2: 25 mmol/L (ref 22–32)
Calcium: 8.9 mg/dL (ref 8.9–10.3)
Chloride: 105 mmol/L (ref 98–111)
Creatinine, Ser: 0.7 mg/dL (ref 0.61–1.24)
GFR, Estimated: >60 mL/min (ref >60)
Glucose, Bld: 114 mg/dL — ABNORMAL HIGH (ref 70–99)
Potassium: 3.5 mmol/L (ref 3.5–5.1)
Sodium: 139 mmol/L (ref 135–145)

## 2020-12-13 LAB — MAGNESIUM: Magnesium: 1.9 mg/dL (ref 1.7–2.4)

## 2020-12-13 LAB — PHOSPHORUS: Phosphorus: 2.5 mg/dL (ref 2.5–4.6)

## 2020-12-13 LAB — AMMONIA: Ammonia: 61 umol/L — ABNORMAL HIGH (ref 9–35)

## 2020-12-13 MED ORDER — POTASSIUM CHLORIDE 20 MEQ PO PACK
40.0000 meq | PACK | Freq: Once | ORAL | Status: AC
  Administered 2020-12-13: 40 meq
  Filled 2020-12-13: qty 2

## 2020-12-13 MED ORDER — MAGNESIUM SULFATE 2 GM/50ML IV SOLN
2.0000 g | Freq: Once | INTRAVENOUS | Status: AC
  Administered 2020-12-13: 2 g via INTRAVENOUS
  Filled 2020-12-13: qty 50

## 2020-12-13 NOTE — Progress Notes (Signed)
     Referral received for [name] for goals of care discussion. Chart reviewed and updates received from RN. Spoke with Dr. Tamala Julian with PCCM. Patient assessed and is unable to engage appropriately in discussions. Contacted patient's Mother Ethan Martinez) and scheduled GOC/Family meeting for today at 11:00. However the patient's brother and sister-in-law visited this morning so the patient's mother and his two children weren't allowed up.    [name] meeting scheduled for 12/14/20 @ 11:30 am. Family is aware we will meet at patient's bedside. Planned to be in attendance are: Patient's mother Ethan Martinez), his brother, sister-in-law, and two children (age approx 52 years).  Detailed note and recommendations to follow once GOC has been completed.   Thank you for allowing [place] to participate in the care of Ethan Lick Lindbloom Sr.   [name], AGNP-C/[name], NP-BC  Palliative Medicine Team  Phone: [phone] Pager: [phone] Amion: Lavell Luster  NO CHARGE
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
## 2020-12-13 NOTE — Progress Notes (Signed)
[facility] Progress Note Patient Name: Ethan BERO Sr. DOB: 09/16/1960 MRN: [id number]   Date of Service  12/13/2020  HPI/Events of Note  RN reports pt having copious yellow secretions draining from mouth.  Has OGT.  TF off for 1.5 hrs now.  OGT placed to LIS & 350 ml out.  RN asking you to assess.  KUB done earlier today per RN  KUB reviewed.generalized ileus. K 3.5 3;10 AM Last bowel movement today.  VS stable. On propofol.  Encephalopathy- anoxic ?   eICU Interventions  Get stat K/mag level, replace if low - continue to hold TF for now.      Intervention Category Intermediate Interventions: Other:  Elmer Sow 12/13/2020, 11:25 PM

## 2020-12-14 DIAGNOSIS — Z7189 Other specified counseling: Secondary | ICD-10-CM

## 2020-12-14 LAB — CBC
HCT: 48.2 % (ref 39.0–52.0)
Hemoglobin: 16 g/dL (ref 13.0–17.0)
MCH: 32.2 pg (ref 26.0–34.0)
MCHC: 33.2 g/dL (ref 30.0–36.0)
MCV: 97 fL (ref 80.0–100.0)
Platelets: 209 10*3/uL (ref 150–400)
RBC: 4.97 MIL/uL (ref 4.22–5.81)
RDW: 11.9 % (ref 11.5–15.5)
WBC: 5 10*3/uL (ref 4.0–10.5)
nRBC: 0 % (ref 0.0–0.2)

## 2020-12-14 LAB — BASIC METABOLIC PANEL
Anion gap: 10 (ref 5–15)
BUN: 21 mg/dL — ABNORMAL HIGH (ref 6–20)
CO2: 28 mmol/L (ref 22–32)
Calcium: 9.4 mg/dL (ref 8.9–10.3)
Chloride: 105 mmol/L (ref 98–111)
Creatinine, Ser: 1.11 mg/dL (ref 0.61–1.24)
GFR, Estimated: >60 mL/min (ref >60)
Glucose, Bld: 119 mg/dL — ABNORMAL HIGH (ref 70–99)
Potassium: 3.8 mmol/L (ref 3.5–5.1)
Sodium: 143 mmol/L (ref 135–145)

## 2020-12-14 LAB — GLUCOSE, CAPILLARY
Glucose-Capillary: 119 mg/dL — ABNORMAL HIGH (ref 70–99)
Glucose-Capillary: 120 mg/dL — ABNORMAL HIGH (ref 70–99)
Glucose-Capillary: 135 mg/dL — ABNORMAL HIGH (ref 70–99)
Glucose-Capillary: 123 mg/dL — ABNORMAL HIGH (ref 70–99)
Glucose-Capillary: 113 mg/dL — ABNORMAL HIGH (ref 70–99)
Glucose-Capillary: 105 mg/dL — ABNORMAL HIGH (ref 70–99)

## 2020-12-14 LAB — POTASSIUM: Potassium: 3.7 mmol/L (ref 3.5–5.1)

## 2020-12-14 LAB — TRIGLYCERIDES: Triglycerides: 209 mg/dL — ABNORMAL HIGH (ref <150)

## 2020-12-14 LAB — MAGNESIUM
Magnesium: 2.3 mg/dL (ref 1.7–2.4)
Magnesium: 2.3 mg/dL (ref 1.7–2.4)

## 2020-12-14 MED ORDER — METOCLOPRAMIDE HCL 5 MG/ML IJ SOLN
10.0000 mg | Freq: Four times a day (QID) | INTRAMUSCULAR | Status: DC
  Administered 2020-12-14 – 2020-12-15 (×5): 10 mg via INTRAVENOUS
  Filled 2020-12-14 (×5): qty 2
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
## 2020-12-14 NOTE — Progress Notes (Signed)
Pt transported from 2M09 to MRI and back with no complications.
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
## 2020-12-15 LAB — GLUCOSE, CAPILLARY
Glucose-Capillary: 113 mg/dL — ABNORMAL HIGH (ref 70–99)
Glucose-Capillary: 120 mg/dL — ABNORMAL HIGH (ref 70–99)
Glucose-Capillary: 111 mg/dL — ABNORMAL HIGH (ref 70–99)
Glucose-Capillary: 107 mg/dL — ABNORMAL HIGH (ref 70–99)

## 2020-12-15 LAB — CBC
HCT: 44.6 % (ref 39.0–52.0)
Hemoglobin: 15.1 g/dL (ref 13.0–17.0)
MCH: 32.5 pg (ref 26.0–34.0)
MCHC: 33.9 g/dL (ref 30.0–36.0)
MCV: 95.9 fL (ref 80.0–100.0)
Platelets: 235 10*3/uL (ref 150–400)
RBC: 4.65 MIL/uL (ref 4.22–5.81)
RDW: 12 % (ref 11.5–15.5)
WBC: 5.2 10*3/uL (ref 4.0–10.5)
nRBC: 0 % (ref 0.0–0.2)

## 2020-12-15 LAB — BASIC METABOLIC PANEL
Anion gap: 16 — ABNORMAL HIGH (ref 5–15)
BUN: 46 mg/dL — ABNORMAL HIGH (ref 6–20)
CO2: 26 mmol/L (ref 22–32)
Calcium: 8.9 mg/dL (ref 8.9–10.3)
Chloride: 100 mmol/L (ref 98–111)
Creatinine, Ser: 1.82 mg/dL — ABNORMAL HIGH (ref 0.61–1.24)
GFR, Estimated: 42 mL/min — ABNORMAL LOW (ref >60)
Glucose, Bld: 114 mg/dL — ABNORMAL HIGH (ref 70–99)
Potassium: 3.6 mmol/L (ref 3.5–5.1)
Sodium: 142 mmol/L (ref 135–145)

## 2020-12-15 MED ORDER — GLYCOPYRROLATE 1 MG PO TABS
1.0000 mg | ORAL_TABLET | ORAL | Status: DC | PRN
  Filled 2020-12-15: qty 1

## 2020-12-15 MED ORDER — DIPHENHYDRAMINE HCL 50 MG/ML IJ SOLN: 25.0000 mg | INTRAMUSCULAR | Status: DC | PRN

## 2020-12-15 MED ORDER — LACTATED RINGERS IV SOLN: INTRAVENOUS | Status: DC

## 2020-12-15 MED ORDER — MORPHINE BOLUS VIA INFUSION
5.0000 mg | INTRAVENOUS | Status: DC | PRN
  Administered 2020-12-15 – 2020-12-16 (×6): 5 mg via INTRAVENOUS
  Filled 2020-12-15: qty 5

## 2020-12-15 MED ORDER — GLYCOPYRROLATE 0.2 MG/ML IJ SOLN
0.2000 mg | INTRAMUSCULAR | Status: DC | PRN
  Administered 2020-12-15 – 2020-12-17 (×2): 0.2 mg via INTRAVENOUS
  Filled 2020-12-15 (×2): qty 1

## 2020-12-15 MED ORDER — MORPHINE 100MG IN NS 100ML (1MG/ML) PREMIX INFUSION
0.0000 mg/h | INTRAVENOUS | Status: DC
  Administered 2020-12-15: 10 mg/h via INTRAVENOUS
  Administered 2020-12-15 – 2020-12-17 (×11): 20 mg/h via INTRAVENOUS
  Filled 2020-12-15 (×12): qty 100

## 2020-12-15 MED ORDER — ACETAMINOPHEN 325 MG PO TABS: 650.0000 mg | ORAL_TABLET | Freq: Four times a day (QID) | ORAL | Status: DC | PRN

## 2020-12-15 MED ORDER — MORPHINE SULFATE (PF) 2 MG/ML IV SOLN
2.0000 mg | INTRAVENOUS | Status: DC | PRN
Start: 2020-12-15 — End: 2020-12-18

## 2020-12-15 MED ORDER — METOCLOPRAMIDE HCL 5 MG/ML IJ SOLN: 10.0000 mg | Freq: Two times a day (BID) | INTRAMUSCULAR | Status: DC

## 2020-12-15 MED ORDER — GLYCOPYRROLATE 0.2 MG/ML IJ SOLN: 0.2000 mg | INTRAMUSCULAR | Status: DC | PRN

## 2020-12-15 MED ORDER — ORAL CARE MOUTH RINSE
15.0000 mL | OROMUCOSAL | Status: DC | PRN
  Administered 2020-12-16: 15 mL via OROMUCOSAL

## 2020-12-15 MED ORDER — DEXTROSE 5 % IV SOLN: INTRAVENOUS | Status: DC

## 2020-12-15 MED ORDER — POLYVINYL ALCOHOL 1.4 % OP SOLN
1.0000 [drp] | Freq: Four times a day (QID) | OPHTHALMIC | Status: DC | PRN
  Filled 2020-12-15: qty 15

## 2020-12-15 MED ORDER — PANTOPRAZOLE SODIUM 40 MG IV SOLR
40.0000 mg | Freq: Every day | INTRAVENOUS | Status: DC
  Administered 2020-12-15: 40 mg via INTRAVENOUS
  Filled 2020-12-15: qty 40

## 2020-12-15 MED ORDER — ACETAMINOPHEN 650 MG RE SUPP: 650.0000 mg | Freq: Four times a day (QID) | RECTAL | Status: DC | PRN

## 2020-12-15 NOTE — Progress Notes (Deleted)
Spoke with representative from [place]. Patient potential candidate for liver donation. Awaiting further information from [facility] prior to terminal extubation. Referral number [id number].

## 2020-12-15 NOTE — Progress Notes (Signed)
With propofol cessation he starts to trigger vent, involuntary twitching of left bicep otherwise still GCS3. Not quite at brain death yet.  Family en route, appreciate palliative help.  Erskine Emery MD PCCM
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
## 2020-12-15 NOTE — Progress Notes (Signed)
Patient was compassionately extubated at 1737 per MD order/family wishes.

## 2020-12-15 NOTE — Progress Notes (Signed)
Palliative Medicine Inpatient Follow Up Note   Reason for Consultation: Disposition and Establishing goals of care   HPI/Patient Profile: 60 y.o. male  with past medical history of HLD, HTN, COPD, and GERD admitted on 12/19/2020 with cardiac arrest. Per chart review, overnight neighbors called EMS due to the patient acting strangely. EMS arrived and patient found down. Unclear down time. Patient found to be in asystole. CPR was conducted for 10 Minutes, 1 epi, with rosc.   Upon arrival to ED, patient remains intubated and on epi drip. 1 IV NS bolus given. Patient non responsive on no sedation. Pupils pinpoint bilaterally. Labs show slightly elevated creatinine, glucose 308, elevated AST and ALT, positive alcohol and cocaine.   Time given for neurologic recovery. EEG with ?myoclonic seizures and diffuse encephalopathy secondary to medication or anoxic/hypoxic injury. MRI yesterday shows consistent with extensive anoxic brain injury with associated diffuse cerebral and cerebral edema with partial effacement of the basilar cisterns but no frank herniation at this time.  Overall prognosis grim due to severe neurologic injury unlikely to be able to maintain airway or breathing on his own compatible with survival.  Today's Discussion (12/15/2020):  *Please note that this is a verbal dictation therefore any spelling or grammatical errors are due to the "[place] [employer]" system interpretation.  Chart reviewed.  Propofol has been stopped for possible consideration of brain death testing.  I called patient's mother, Ethan Martinez this morning.  I shared with her my concerns over Ethan Martinez's condition and the possibility that he may be progressing to complete brain death.  I shared with her that at this point in time it appears Ethan Martinez's body is making it known that he will not be able to recover.  I stated that we should at this point in time make him a DO NOT RESUSCITATE which she agreed with.  I shared that family  should come in and visit and strongly recommended we remove the tube from his windpipe and allow him to pass away as peacefully as possible.    She shares additional family members will be coming this afternoon.  She is very tearful over the phone though thankful for this information.  I provided emotional support through therapeutic listening.  I enabled her time to state her thoughts.  Ethan Martinez expressed thanks and shared again that she will be in later on today.  Questions and concerns addressed   Objective Assessment: Vital Signs Vitals:   12/15/20 0730 12/15/20 0930  BP: 104/74   Pulse: 92   Resp: 13   Temp: 98.78 F (37.1 C)   SpO2: 98% 94%    Intake/Output Summary (Last 24 hours) at 12/15/2020 0958 Last data filed at 12/15/2020 0730 Gross per 24 hour  Intake 665.01 ml  Output 3410 ml  Net -2744.99 ml   Last Weight  Most recent update: 12/15/2020  7:17 AM    Weight  74 kg (163 lb 2.3 oz)            Physical Exam Constitutional:      Appearance: He is ill-appearing.    Interventions: He is intubated. Eyes:    Pupils:    Right eye: Pupil is not reactive.    Left eye: Pupil is not reactive. Cardiovascular:    Rate and Rhythm: Normal rate and regular rhythm. Pulmonary:    Effort: He is intubated. Abdominal:    General: Abdomen is protuberant. There is distension.    Comments: Firm but not tense  Skin:  Comments: Upper extremities warm, bilateral lower extremities cool  Neurological:    Mental Status: He is unresponsive.   SUMMARY OF RECOMMENDATIONS   DNAR  Discussed with family the possibility of progression to complete brain death - strongly recommended compassionate extubation   Patient's family will be here this afternoon   Ongoing palliative care support  Time Spent: 35 Greater than 50% of the time was spent in counseling and coordination of care ______________________________________________________________________________________ [facility] Team Team Cell Phone: [phone] Please utilize secure chat with additional questions, if there is no response within 30 minutes please call the above phone number  Palliative Medicine Team providers are available by phone from 7am to 7pm daily and can be reached through the team cell phone.  Should this patient require assistance outside of these hours, please call the patient's attending physician.

## 2020-12-16 MED ORDER — MIDAZOLAM 50MG/50ML (1MG/ML) PREMIX INFUSION
0.5000 mg/h | INTRAVENOUS | Status: DC
  Administered 2020-12-16 (×2): 10 mg/h via INTRAVENOUS
  Administered 2020-12-16: 2 mg/h via INTRAVENOUS
  Administered 2020-12-17 (×5): 10 mg/h via INTRAVENOUS
  Filled 2020-12-16 (×8): qty 50

## 2020-12-16 NOTE — Progress Notes (Signed)
This chaplain responded to PMT referral for family spiritual care.  Family is not present at the time of the visit.    The chaplain is appreciative of the RN-Veronica's willingness to assist the chaplain and the Pt. RN-April in determining the best time for a spiritual care visit, [phone].

## 2020-12-16 NOTE — Progress Notes (Signed)
NAME:  Diana Armijo., MRN:  [id number], DOB:  1961/04/19, LOS: 6 ADMISSION DATE:  11/22/2020, CONSULTATION DATE:  6/21 REFERRING MD:  Dr. Roxanne Mins, CHIEF COMPLAINT:  Post cardiac arrest   History of Present Illness:  Benard Rink Sr. Is a 60 y.o., with pertinent PMH of HLD, HTN, COPD, and GERD, who presented to [facility] in cardiac arrest. Per chart review, overnight neighbors called EMS due to the patient acting strangely. EMS arrived and patient found down. Unclear down time. Patient found to be in asystole. CPR was conducted for 10 Minutes, 1 epi, with rosc.  Upon arrival to ED, patient remains intubated and on epi drip. 1 IV NS bolus given. Patient non responsive on no sedation. Pupils pinpoint bilaterally. Labs show slightly elevated creatinine, glucose 308, elevated AST and ALT, positive alcohol and cocaine. CCM consulted for post cardiac arrest and vent management.  He remains critically ill in the 22M ICU.    Significant Hospital Events: Including procedures, antibiotic start and stop dates in addition to other pertinent events   6/20: patient admitted to [facility] for post cardiac arrest; unknown down time; 10 minute ROSC in field. CT head negative. EEG ordered 6/21- EEG atypical, ?myoclonic seizures, eeg suggestive of diffuse encephalopathy ?secondary to medication or anoxic/hypoxic injury. Jerking worsened when propofol stopped. ECHO> LVEF 55 to 60%, mild LVH, NWMA, grade 1 DD, RVSF normal 6/24 MRI confirms severe anoxic injury  Interim History / Subjective:  6/27: compassionately extubated 6/26. Comfort measures.   Objective   Blood pressure 140/80, pulse (!) 102, temperature 98.42 F (36.9 C), resp. rate (!) 30, height 5\' 6"  (1.676 m), weight 74 kg, SpO2 (!) 89 %.    Vent Mode: PRVC FiO2 (%):  [40 %] 40 % Set Rate:  [16 bmp] 16 bmp Vt Set:  [500 mL] 500 mL PEEP:  [5 cmH20] 5 cmH20 Plateau Pressure:  [17 cmH20-18 cmH20] 18 cmH20   Intake/Output Summary (Last 24 hours) at  12/16/2020 0750 Last data filed at 12/16/2020 0700 Gross per 24 hour  Intake 1276.97 ml  Output 225 ml  Net 1051.97 ml   Filed Weights   12/13/20 0500 12/14/20 0500 12/15/20 0500  Weight: 74.2 kg 74.2 kg 74 kg    Examination:  Pupils pinpoint, not reacting Dolls/corneals/cough/gag absent GCS3   Labs/imaging that I havepersonally reviewed  (right click and "Reselect all SmartList Selections" daily)  MRI with extensive brain injury  Resolved Hospital Problem list     Assessment & Plan:  OOH unwitnessed cardiac arrest- ~10ns CPR, myoclonus on ROSC.  Question of intoxication preceding, cocaine +.  Echo benign. Post arrest severe anoxic brain injury- with edema and three Ileus Likely aspiration pneumonitis Worsening renal function Central fevers  - s/p 5 days unasyn - compassionately extubated 6/26 - Continue family discussions, appreciate palliative help - No cultures  Best Practice (right click and "Reselect all SmartList Selections" daily)   Diet/type: npo Pain/Anxiety/Delirium: prn comfort VAP protocol (if indicated): Yes DVT prophylaxis: not indicated GI prophylaxis: N/A Glucose control:  not indicated Central venous access:  N/A Arterial line:  N/A Foley:  yes Mobility:  bed rest  PT consulted: N/A Studies pending: None Culture data pending:none  Last reviewed culture data:today Antibiotics:not indicated.  Antibiotic de-escalation: N/A Stop date: N/A Daily labs: not indicated Code Status:  full code Last date of multidisciplinary goals of care discussion: 6/25, terminal prognosis relayed, palliative involved ccm prognosis: Life-threating Disposition: in-patient comfort care status   Critical care time: The  patient is critically ill with multiple organ systems failure and requires high complexity decision making for assessment and support, frequent evaluation and titration of therapies, application of advanced monitoring technologies and extensive  interpretation of multiple databases.  Critical care time 41 mins. This represents my time independent of the NPs time taking care of the pt. This is excluding procedures.    Audria Nine DO [facility] 12/16/2020, 7:50 AM See Amion for pager If no response to pager, please call [phone] until 1900 After 1900 please call [facility] [phone]

## 2020-12-16 NOTE — Progress Notes (Signed)
Nutrition Brief Note  Chart reviewed. Pt now transitioning to comfort care.  No further nutrition interventions planned at this time.  Please re-consult as needed.   Airyanna Dipalma W, RD, LDN, CDCES Registered Dietitian II Certified Diabetes Care and Education Specialist Please refer to AMION for RD and/or RD on-call/weekend/after hours pager   

## 2020-12-16 NOTE — Progress Notes (Signed)
Daily Progress Note   Patient Name: Ethan HONSE Sr.       Date: 12/16/2020 DOB: 18-Jan-1961  Age: 60 y.o. MRN#: [id number] Attending Physician: Audria Nine, DO Primary Care Physician: Pcp, No Admit Date: 12/09/2020  Reason for Consultation/Follow-up: Non pain symptom management, Pain control, and Terminal Care   To discuss complex medical decision making related to patient's goals of care.   Chart reviewed. Patient examined. RN notified.   Subjective: Ethan Martinez, a 21-yo male, is resting in bed, asleep. Unresponsive to light touch throughout examination.  Family was not present at bedside and not expected to stop by today.   Assessment: Patient is asleep in bed, appears comfortable, in no acute distress. Breathing was not labored and no distressing breath sounds were noted. No increased secretions were noted. Heart sounds were distant. Full comfort care - Actively dying.   Patient Profile/HPI: 60 y.o. male  with past medical history of HLD, HTN, COPD, and GERD admitted on 11/29/2020 with cardiac arrest. Per chart review, overnight neighbors called EMS due to the patient acting strangely. EMS arrived and patient found down. Unclear down time. Patient found to be in asystole. CPR was conducted for 10 Minutes, 1 epi, with rosc.   Upon arrival to ED, patient remains intubated and on epi drip. 1 IV NS bolus given. Patient non responsive on no sedation. Pupils pinpoint bilaterally. Labs show slightly elevated creatinine, glucose 308, elevated AST and ALT, positive alcohol and cocaine.   Time given for neurologic recovery. EEG with ?myoclonic seizures and diffuse encephalopathy secondary to medication or anoxic/hypoxic injury. MRI yesterday shows consistent with extensive anoxic brain injury  with associated diffuse cerebral and cerebral edema with partial effacement of the basilar cisterns but no frank herniation at this time.  Overall prognosis grim due to severe neurologic injury unlikely to be able to maintain airway or breathing on his own compatible with survival.   Length of Stay: 6   Vital Signs: BP 140/80 (BP Location: Left Leg)   Pulse (!) 113   Temp 99.32 F (37.4 C)   Resp (!) 25   Ht 5\' 6"  (1.676 m)   Wt 74 kg   SpO2 (!) 82%   BMI 26.33 kg/m  SpO2: SpO2: (!) 82 % O2 Device: O2 Device: Room Air O2 Flow Rate:  Palliative Assessment/Data: 10% PPS     Palliative Care Plan    Recommendations/Plan: Full comfort care  Continue to ensure comfort and manage symptoms as needed  Code Status:  DNR  Prognosis:  Hours - Days   Discharge Planning: Anticipated Hospital Death  Care plan was discussed with RN at bedside. Care plan was discussed previously with family on 6/26.   Thank you for allowing the Palliative Medicine Team to assist in the care of this patient.  Total time spent:  15     Greater than 50%  of this time was spent counseling and coordinating care related to the above assessment and plan.  Florentina Jenny, PA-C Palliative Medicine Demetrios Isaacs, PA-S2  Please contact Palliative MedicineTeam phone at [phone] for questions and concerns between 7 am - 7 pm.   Please see AMION for individual provider pager numbers.

## 2020-12-17 DIAGNOSIS — G931 Anoxic brain damage, not elsewhere classified: Secondary | ICD-10-CM

## 2020-12-17 DIAGNOSIS — R0600 Dyspnea, unspecified: Secondary | ICD-10-CM

## 2020-12-17 DIAGNOSIS — Z515 Encounter for palliative care: Secondary | ICD-10-CM

## 2020-12-17 LAB — CULTURE, RESPIRATORY W GRAM STAIN

## 2020-12-17 MED ORDER — GLYCOPYRROLATE 1 MG PO TABS
1.0000 mg | ORAL_TABLET | ORAL | Status: DC | PRN
  Filled 2020-12-17: qty 1

## 2020-12-17 MED ORDER — GLYCOPYRROLATE 0.2 MG/ML IJ SOLN: 0.2000 mg | INTRAMUSCULAR | Status: DC | PRN

## 2020-12-17 MED ORDER — GLYCOPYRROLATE 0.2 MG/ML IJ SOLN
0.4000 mg | INTRAMUSCULAR | Status: DC | PRN
  Administered 2020-12-17 (×2): 0.4 mg via INTRAVENOUS
  Filled 2020-12-17 (×2): qty 2

## 2020-12-17 NOTE — Progress Notes (Signed)
Called Denym Rahimi back. He will be coming to visit this afternoon.

## 2020-12-17 NOTE — Progress Notes (Signed)
Pt continues to have excess secretions. Administered 0.4 mg robinul.  Pt's son Ethan Martinez is on his way to visit.

## 2020-12-17 NOTE — Progress Notes (Signed)
Pt did not have any visitors on dayshift today.  Pt unresponsive--even during mouth care and personal hygiene.  Tea-colored urine. No BM.

## 2020-12-17 NOTE — Progress Notes (Signed)
This chaplain attempted to respond to PMT referral for family spiritual care.  The Pt. was extubated on 6/26 and is on full comfort measures.    The Pt. family is not present at the time of the visit. The chaplain checked in with the Pt. RN-Jamie.  The chaplain understands the Pt. family visited last night.   This chaplain is available for F/U spiritual care as needed, [phone].

## 2020-12-17 NOTE — Progress Notes (Signed)
Called pt's brother back with an update. Pt is having more difficulty breathing--more labored and more secretions than this morning. We are using robinul to help with the secretions. Pt is not showing signs of pain.  Pt's brother said he would update the family. Please call him back if anything changes.

## 2020-12-17 NOTE — Progress Notes (Signed)
Started new 'bag' of versed. Administered robinul for excess secretions.  Pt unresponsive.

## 2020-12-17 NOTE — Care Management Important Message (Signed)
Important Message  Patient Details  Name: Ethan CONAWAY Sr. MRN: [id number] Date of Birth: August 07, 1960   Medicare Important Message Given:  Yes  Patient is at the end of life out of respect no  IM given in the room but will mail to the patient home address.   Kolton Kienle 12/17/2020, 3:07 PM

## 2020-12-17 NOTE — Progress Notes (Signed)
   NAME:  Ethan Soule., MRN:  [id number], DOB:  27-Dec-1960, LOS: 7 ADMISSION DATE:  12/06/2020, CONSULTATION DATE:  6/21 REFERRING MD:  Dr. Roxanne Mins, CHIEF COMPLAINT:  Post cardiac arrest   History of Present Illness:  Ethan Rink Sr. Is a 60 y.o., with pertinent PMH of HLD, HTN, COPD, and GERD, who presented to [facility] in cardiac arrest. Per chart review, overnight neighbors called EMS due to the patient acting strangely. EMS arrived and patient found down. Unclear down time. Patient found to be in asystole. CPR was conducted for 10 Minutes, 1 epi, with rosc.  Upon arrival to ED, patient remains intubated and on epi drip. 1 IV NS bolus given. Patient non responsive on no sedation. Pupils pinpoint bilaterally. Labs show slightly elevated creatinine, glucose 308, elevated AST and ALT, positive alcohol and cocaine. CCM consulted for post cardiac arrest and vent management.     Significant Hospital Events: Including procedures, antibiotic start and stop dates in addition to other pertinent events   6/20: patient admitted to [facility] for post cardiac arrest; unknown down time; 10 minute ROSC in field. CT head negative. EEG ordered 6/21- EEG atypical, ?myoclonic seizures, eeg suggestive of diffuse encephalopathy ?secondary to medication or anoxic/hypoxic injury. Jerking worsened when propofol stopped. ECHO> LVEF 55 to 60%, mild LVH, NWMA, grade 1 DD, RVSF normal 6/24 MRI confirms severe anoxic injury compassionately extubated 6/26.  6/27: Comfort measures.   Interim History / Subjective:   On Versed at 10 and morphine at 20  Objective   Blood pressure 101/62, pulse (!) 123, temperature 98.2 F (36.8 C), temperature source Oral, resp. rate (!) 21, height 5\' 6"  (1.676 m), weight 74 kg, SpO2 (!) 69 %.        Intake/Output Summary (Last 24 hours) at 12/17/2020 0857 Last data filed at 12/17/2020 2633 Gross per 24 hour  Intake 572.59 ml  Output 700 ml  Net -127.41 ml    Filed Weights   12/13/20  0500 12/14/20 0500 12/15/20 0500  Weight: 74.2 kg 74.2 kg 74 kg    Examination: Appears comfortable, sonorous respirations Pupils pinpoint, not reacting Dolls/corneals/cough/gag absent GCS3   Labs/imaging that I havepersonally reviewed  (right click and "Reselect all SmartList Selections" daily)  MRI with extensive brain injury  Resolved Hospital Problem list   Likely aspiration pneumonitis- s/p 5 days unasyn  Assessment & Plan:  OOH unwitnessed cardiac arrest- ~10ns CPR, myoclonus on ROSC.  Question of intoxication preceding, cocaine +.  Echo benign. Post arrest severe anoxic brain injury- with edema and three Ileus AKI Central fevers   -  extubated 6/26 -Full comfort care measures, morphine prn for breakthrough   Kara Mead MD. FCCP. [facility] Pager : [phone]  If no response to pager , please call [phone] until 7 pm After 7:00 pm call Elink  [phone]   12/17/2020

## 2020-12-17 NOTE — Progress Notes (Signed)
Palliative Medicine Inpatient Follow Up Note   HPI: 60 y.o. male  with past medical history of HLD, HTN, COPD, and GERD admitted on 12/05/2020 with cardiac arrest. Per chart review, overnight neighbors called EMS due to the patient acting strangely. EMS arrived and patient found down. Unclear down time. Patient found to be in asystole. CPR was conducted for 10 Minutes, 1 epi, with rosc.   Upon arrival to ED, patient remains intubated and on epi drip. 1 IV NS bolus given. Patient non responsive on no sedation. Pupils pinpoint bilaterally. Labs show slightly elevated creatinine, glucose 308, elevated AST and ALT, positive alcohol and cocaine.   Time given for neurologic recovery. EEG with ?myoclonic seizures and diffuse encephalopathy secondary to medication or anoxic/hypoxic injury. MRI yesterday shows consistent with extensive anoxic brain injury with associated diffuse cerebral and cerebral edema with partial effacement of the basilar cisterns but no frank herniation at this time.  Overall prognosis grim due to severe neurologic injury unlikely to be able to maintain airway or breathing on his own compatible with survival.  Underwent compassionate extubation 12/15/20. Currently on comfort care with 20mg  Morphine/hour and 10 mg Versed/hour gtts.  Today's Discussion (* ): Reviewed the chart and discussed with the nursing staff.  The patient does not have any family at bedside today, they were here yesterday.  Nurse indicates that the family is having a difficult time with the patient's clinical scenario.  *Please note that this is a verbal dictation therefore any spelling or grammatical errors are due to the "[place] [employer]" system interpretation.  Discussed the importance of continued conversation with family and their  medical providers regarding overall plan of care and treatment options, ensuring decisions are within the context of the patients values and GOCs.  Questions and concerns  addressed.  Objective Assessment: Vital Signs Vitals:   12/16/20 2000 12/17/20 0618  BP:  101/62  Pulse: (!) 124 (!) 123  Resp: (!) 23 (!) 21  Temp:  98.2 F (36.8 C)  SpO2: (!) 77% (!) 69%    Intake/Output Summary (Last 24 hours) at 12/17/2020 1135 Last data filed at 12/17/2020 4034 Gross per 24 hour  Intake 478.96 ml  Output 700 ml  Net -221.04 ml   Last Weight  Most recent update: 12/15/2020  7:17 AM    Weight  74 kg (163 lb 2.3 oz)            Gen:  NAD, appears comfortable. Feels warm to the touch, possibly febrile. HEENT: moist mucous membranes CV: Tachycardic, regular rate and rhythm, no murmurs rubs or gallops PULM: Bilateral rhonchi. Patient with brief apneic pauses, congestion noted (RN bringing Robinul) ABD: Distended, minimally active bowel sounds EXT: No edema. Warm extremities, pedal pulses present Neuro: Unresponsive.  SUMMARY OF RECOMMENDATIONS   Continued comfort care  Continued Family support  Time In: 9:40 Time Out: 10:05 Time Spent: 25 minutes Greater than 50% of the time was spent in counseling and coordination of care ______________________________________________________________________________________ [name], NP [facility] Team Team Cell Phone: [phone]  This NP Wadie Lessen was in collaboration/discussion with [name] NP  and is  in agreement  with above assessment and plan.   Please utilize secure chat with additional questions, if there is no response within 30 minutes please call the above phone number    Palliative Medicine Team providers are available by phone from 7am to 7pm daily and can be reached through the team cell phone.  Should this  patient require assistance outside of these hours, please call the patient's attending physician.

## 2020-12-20 NOTE — Progress Notes (Signed)
Wasted approximately 55ml of versed in [place] with Anne Fu, RN.

## 2020-12-20 NOTE — Progress Notes (Signed)
Pt expired at 0051. Son, Prinston Kynard., made aware. Mother, Ethan Martinez, made aware and on the way to the hospital to see pt. Daughter, Marcha Solders, has been called and went straight to the voicemail left a call back number.

## 2020-12-20 DEATH — deceased

## 2020-12-27 ENCOUNTER — Other Ambulatory Visit: Payer: Self-pay | Admitting: Family

## 2021-01-20 NOTE — Discharge Summary (Signed)
   NAME:  Ethan Martinez., MRN:  [id number], DOB:  1960-09-04, LOS: 8 ADMISSION DATE:  11/22/2020, CONSULTATION DATE:  6/21 REFERRING MD:  Dr. Roxanne Mins, CHIEF COMPLAINT:  Post cardiac arrest   History of Present Illness:  Benard Rink Sr. Is a 60 y.o., with pertinent PMH of HLD, HTN, COPD, and GERD, who presented to [facility] in cardiac arrest. Per chart review, overnight neighbors called EMS due to the patient acting strangely. EMS arrived and patient found down. Unclear down time. Patient found to be in asystole. CPR was conducted for 10 Minutes, 1 epi, with rosc.  Upon arrival to ED, patient was intubated and on epi drip. 1 IV NS bolus given. Patient non responsive on no sedation. Pupils pinpoint bilaterally. Labs show slightly elevated creatinine, glucose 308, elevated AST and ALT, positive alcohol and cocaine. CCM consulted for post cardiac arrest and vent management.     Significant Hospital Events: Including procedures, antibiotic start and stop dates in addition to other pertinent events   6/20: patient admitted to [facility] for post cardiac arrest; unknown down time; 10 minute ROSC in field. CT head negative. EEG ordered 6/21- EEG atypical, ?myoclonic seizures, eeg suggestive of diffuse encephalopathy ?secondary to medication or anoxic/hypoxic injury. Jerking worsened when propofol stopped. ECHO> LVEF 55 to 60%, mild LVH, NWMA, grade 1 DD, RVSF normal 6/24 MRI confirms severe anoxic injury compassionately extubated 6/26.  6/27: Comfort measures.    Labs/imaging that I havepersonally reviewed  (right click and "Reselect all SmartList Selections" daily)  MRI with extensive brain injury  Resolved Hospital Problem list   Likely aspiration pneumonitis- s/p 5 days unasyn  COURSE:  [facility] unwitnessed cardiac arrest- ~10ns CPR, myoclonus on ROSC.  Question of intoxication preceding, cocaine +.  Echo benign. Post arrest severe anoxic brain injury- with edema  Ileus AKI Central fevers   -   extubated 6/26 -Full comfort care measures He passed away on 12-30-2022 at 0051  Cause of death : Acute coronary syndrome with anoxic encephalopathy   Kara Mead MD. [facility]. [facility]   12/24/2020
